# Patient Record
Sex: Female | Born: 2000 | Race: White | Hispanic: No | Marital: Single | Smoking: Current some day smoker
Health system: Southern US, Community
[De-identification: ages and names within clinical notes are randomized; demographics above are authoritative.]

## PROBLEM LIST (undated history)

## (undated) DIAGNOSIS — Z8619 Personal history of other infectious and parasitic diseases: Secondary | ICD-10-CM

## (undated) DIAGNOSIS — R569 Unspecified convulsions: Secondary | ICD-10-CM

## (undated) DIAGNOSIS — F32A Depression, unspecified: Secondary | ICD-10-CM

## (undated) DIAGNOSIS — G43909 Migraine, unspecified, not intractable, without status migrainosus: Secondary | ICD-10-CM

## (undated) DIAGNOSIS — E119 Type 2 diabetes mellitus without complications: Secondary | ICD-10-CM

## (undated) DIAGNOSIS — F419 Anxiety disorder, unspecified: Secondary | ICD-10-CM

## (undated) HISTORY — PX: CHOLECYSTECTOMY: SHX55

## (undated) HISTORY — PX: DENTAL RESTORATION/EXTRACTION WITH X-RAY: SHX5796

## (undated) HISTORY — DX: Anxiety disorder, unspecified: F41.9

## (undated) HISTORY — DX: Depression, unspecified: F32.A

---
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
## 2020-03-01 ENCOUNTER — Other Ambulatory Visit: Payer: Self-pay | Admitting: Certified Nurse Midwife

## 2020-03-01 DIAGNOSIS — N6310 Unspecified lump in the right breast, unspecified quadrant: Secondary | ICD-10-CM

## 2020-03-09 ENCOUNTER — Ambulatory Visit: Payer: BC Managed Care – PPO | Admitting: Neurology

## 2020-03-09 ENCOUNTER — Emergency Department (HOSPITAL_COMMUNITY)
Admission: EM | Admit: 2020-03-09 | Discharge: 2020-03-10 | Disposition: A | Discharge status: Home / Self Care | Payer: BLUE CROSS/BLUE SHIELD | Attending: Emergency Medicine | Admitting: Emergency Medicine

## 2020-03-09 ENCOUNTER — Encounter (HOSPITAL_COMMUNITY): Payer: Self-pay | Admitting: Emergency Medicine

## 2020-03-09 ENCOUNTER — Other Ambulatory Visit: Payer: Self-pay

## 2020-03-09 DIAGNOSIS — G43909 Migraine, unspecified, not intractable, without status migrainosus: Secondary | ICD-10-CM | POA: Insufficient documentation

## 2020-03-09 DIAGNOSIS — R569 Unspecified convulsions: Secondary | ICD-10-CM | POA: Insufficient documentation

## 2020-03-09 LAB — CBC
HCT: 36.7 % (ref 36.0–46.0)
Hemoglobin: 12 g/dL (ref 12.0–15.0)
MCH: 30.7 pg (ref 26.0–34.0)
MCHC: 32.7 g/dL (ref 30.0–36.0)
MCV: 93.9 fL (ref 80.0–100.0)
Platelets: 247 10*3/uL (ref 150–400)
RBC: 3.91 MIL/uL (ref 3.87–5.11)
RDW: 12 % (ref 11.5–15.5)
WBC: 6 10*3/uL (ref 4.0–10.5)
nRBC: 0 % (ref 0.0–0.2)

## 2020-03-09 LAB — BASIC METABOLIC PANEL
Anion gap: 10 (ref 5–15)
BUN: 12 mg/dL (ref 6–20)
CO2: 22 mmol/L (ref 22–32)
Calcium: 9.3 mg/dL (ref 8.9–10.3)
Chloride: 106 mmol/L (ref 98–111)
Creatinine, Ser: 0.77 mg/dL (ref 0.44–1.00)
GFR calc Af Amer: >60 mL/min (ref >60)
GFR calc non Af Amer: >60 mL/min (ref >60)
Glucose, Bld: 104 mg/dL — ABNORMAL HIGH (ref 70–99)
Potassium: 3.8 mmol/L (ref 3.5–5.1)
Sodium: 138 mmol/L (ref 135–145)

## 2020-03-09 LAB — I-STAT BETA HCG BLOOD, ED (MC, WL, AP ONLY): I-stat hCG, quantitative: <5 m[IU]/mL (ref <5)

## 2020-03-09 NOTE — ED Notes (Signed)
Beta results <5.0 IU/L

## 2020-03-09 NOTE — ED Triage Notes (Signed)
Patient states that she thinks she had a seizure 03/08/2020. Patient does not know when she had her last period. Patient states she had seizures as a baby. Patient is not currently taking any medication because they took her off it when she was long.

## 2020-03-10 NOTE — Discharge Instructions (Signed)
Per [place] DMV statutes, patients with seizures are not allowed to drive until  they have been seizure-free for six months. Use caution when using heavy equipment or power tools. Avoid working on ladders or at heights. Take showers instead of baths. Ensure the water temperature is not too high on the home water heater. Do not go swimming alone. When caring for infants or small children, sit down when holding, feeding, or changing them to minimize risk of injury to the child in the event you have a seizure.   Also, Maintain good sleep hygiene. Avoid alcohol.  

## 2020-03-10 NOTE — ED Provider Notes (Signed)
[facility] DEPT Provider Note   CSN: [id number] Arrival date & time: 03/09/20  2033     History Chief Complaint  Patient presents with  . Seizures    Morgan [name] is a 19 y.o. female.  Patient presents to the ED with a chief complaint of seizure.  She states that last night her boyfriend told her that she was twitching for few seconds while they were asleep.  Patient states that she has hx of seizures, but doesn't take any meds because she hasn't had a seizure in years.  She is not followed by a neurologist.  She denies any vision changes or speech changes.  She denies any other associated symptoms.  The history is provided by the patient. No language interpreter was used.       Past Medical History:  Diagnosis Date  . Migraines   . Seizures ([place])     There are no problems to display for this patient.   Past Surgical History:  Procedure Laterality Date  . CHOLECYSTECTOMY       OB History   No obstetric history on file.     History reviewed. No pertinent family history.  Social History   Tobacco Use  . Smoking status: Never Smoker  . Smokeless tobacco: Never Used  Vaping Use  . Vaping Use: Some days  Substance Use Topics  . Alcohol use: Never  . Drug use: Never    Home Medications Prior to Admission medications   Medication Sig Start Date End Date Taking? Authorizing Provider  lidocaine (XYLOCAINE) 2 % solution Use as directed 15 mLs in the mouth or throat every 3 (three) hours as needed for mouth pain. 10/07/18   McDonald, Mia A, PA-C  ondansetron (ZOFRAN ODT) 4 MG disintegrating tablet Take 1 tablet (4 mg total) by mouth every 8 (eight) hours as needed for nausea or vomiting. 10/07/18   McDonald, Mia A, PA-C    Allergies    Patient has no known allergies.  Review of Systems   Review of Systems  Neurological: Positive for seizures.  All other systems reviewed and are negative.   Physical Exam Updated Vital  Signs BP 125/78 (BP Location: Left Arm)   Pulse 78   Temp 98.7 F (37.1 C) (Oral)   Resp 14   Ht 4\' 11"  (1.499 m)   Wt 43.1 kg   LMP  (LMP Unknown)   SpO2 100%   BMI 19.19 kg/m   Physical Exam Vitals and nursing note reviewed.  Constitutional:      General: She is not in acute distress.    Appearance: She is well-developed.  HENT:     Head: Normocephalic and atraumatic.  Eyes:     Conjunctiva/sclera: Conjunctivae normal.  Cardiovascular:     Rate and Rhythm: Normal rate and regular rhythm.     Heart sounds: No murmur heard.   Pulmonary:     Effort: Pulmonary effort is normal. No respiratory distress.     Breath sounds: Normal breath sounds.  Abdominal:     Palpations: Abdomen is soft.     Tenderness: There is no abdominal tenderness.  Musculoskeletal:        General: Normal range of motion.     Cervical back: Neck supple.  Skin:    General: Skin is warm and dry.  Neurological:     Mental Status: She is alert and oriented to person, place, and time.     Comments: CN 3-12 intact Speech is clear  Normal gait Movements are goal oriented  Psychiatric:        Mood and Affect: Mood normal.        Behavior: Behavior normal.     ED Results / Procedures / Treatments   Labs (all labs ordered are listed, but only abnormal results are displayed) Labs Reviewed  BASIC METABOLIC PANEL - Abnormal; Notable for the following components:      Result Value   Glucose, Bld 104 (*)    All other components within normal limits  CBC  I-STAT BETA HCG BLOOD, ED ([facility], [facility], [facility] ONLY)    EKG None  Radiology No results found.  Procedures Procedures (including critical care time)  Medications Ordered in ED Medications - No data to display  ED Course  I have reviewed the triage vital signs and the nursing notes.  Pertinent labs & imaging results that were available during my care of the patient were reviewed by me and considered in my medical decision making (see chart for  details).    MDM Rules/Calculators/A&P                          Patient here for evaluation of possible seizure. Reportedly had a few seconds of twitching last night while asleep.  Hasn't had a seizure in years, but has had them before.  She isn't treated for them and reports having had a normal EEG in the past.    Labs are reassuring.  No neuro deficit.  I don't think that she requires any additional emergent workup or consultation.  I have placed ambulatory referral for neurology and have put patient on driving restrictions.  She understands and agrees with the plan.   Final Clinical Impression(s) / ED Diagnoses Final diagnoses:  Seizure-like activity [facility])    Rx / [place] Orders ED Discharge Orders    None       Montine Circle, PA-C 09/81/19 1478    Delora Fuel, MD 29/56/21 [phone]

## 2020-03-14 ENCOUNTER — Encounter: Payer: Self-pay | Admitting: Neurology

## 2020-03-14 ENCOUNTER — Other Ambulatory Visit: Payer: Self-pay | Admitting: Certified Nurse Midwife

## 2020-03-16 ENCOUNTER — Other Ambulatory Visit: Payer: Self-pay | Admitting: Certified Nurse Midwife

## 2020-03-16 ENCOUNTER — Ambulatory Visit
Admission: RE | Admit: 2020-03-16 | Discharge: 2020-03-16 | Disposition: A | Discharge status: Home / Self Care | Payer: Medicaid Other | Source: Ambulatory Visit | Attending: Certified Nurse Midwife | Admitting: Certified Nurse Midwife

## 2020-03-16 ENCOUNTER — Other Ambulatory Visit: Payer: Self-pay

## 2020-03-16 DIAGNOSIS — N6310 Unspecified lump in the right breast, unspecified quadrant: Secondary | ICD-10-CM

## 2020-03-23 ENCOUNTER — Other Ambulatory Visit: Payer: Self-pay | Admitting: Certified Nurse Midwife

## 2020-03-23 ENCOUNTER — Other Ambulatory Visit: Payer: Self-pay

## 2020-03-23 ENCOUNTER — Ambulatory Visit
Admission: RE | Admit: 2020-03-23 | Discharge: 2020-03-23 | Disposition: A | Discharge status: Home / Self Care | Payer: Medicaid Other | Source: Ambulatory Visit | Attending: Certified Nurse Midwife | Admitting: Certified Nurse Midwife

## 2020-03-23 DIAGNOSIS — N6310 Unspecified lump in the right breast, unspecified quadrant: Secondary | ICD-10-CM

## 2020-06-20 ENCOUNTER — Ambulatory Visit: Payer: Medicaid Other | Admitting: Neurology

## 2020-07-16 ENCOUNTER — Ambulatory Visit (HOSPITAL_COMMUNITY)
Admission: EM | Admit: 2020-07-16 | Discharge: 2020-07-16 | Disposition: A | Discharge status: Home / Self Care | Payer: Medicaid Other | Attending: Family Medicine | Admitting: Family Medicine

## 2020-07-16 ENCOUNTER — Encounter (HOSPITAL_COMMUNITY): Payer: Self-pay

## 2020-07-16 ENCOUNTER — Other Ambulatory Visit: Payer: Self-pay

## 2020-07-16 DIAGNOSIS — Z9049 Acquired absence of other specified parts of digestive tract: Secondary | ICD-10-CM | POA: Insufficient documentation

## 2020-07-16 DIAGNOSIS — R112 Nausea with vomiting, unspecified: Secondary | ICD-10-CM

## 2020-07-16 DIAGNOSIS — Z886 Allergy status to analgesic agent status: Secondary | ICD-10-CM | POA: Diagnosis not present

## 2020-07-16 DIAGNOSIS — Z20822 Contact with and (suspected) exposure to covid-19: Secondary | ICD-10-CM | POA: Insufficient documentation

## 2020-07-16 DIAGNOSIS — F17200 Nicotine dependence, unspecified, uncomplicated: Secondary | ICD-10-CM | POA: Insufficient documentation

## 2020-07-16 DIAGNOSIS — Z8379 Family history of other diseases of the digestive system: Secondary | ICD-10-CM | POA: Insufficient documentation

## 2020-07-16 LAB — SARS CORONAVIRUS 2 (TAT 6-24 HRS): SARS Coronavirus 2: NEGATIVE

## 2020-07-16 MED ORDER — ONDANSETRON 4 MG PO TBDP
ORAL_TABLET | ORAL | Status: AC
  Filled 2020-07-16: qty 1

## 2020-07-16 MED ORDER — ONDANSETRON 4 MG PO TBDP: 4.0000 mg | ORAL_TABLET | Freq: Three times a day (TID) | ORAL | 0 refills | Status: DC | PRN

## 2020-07-16 MED ORDER — ONDANSETRON 4 MG PO TBDP
4.0000 mg | ORAL_TABLET | Freq: Once | ORAL | Status: AC
  Administered 2020-07-16: 4 mg via ORAL

## 2020-07-16 NOTE — ED Triage Notes (Signed)
Patient presents to Urgent Care with complaints of vomiting x 3 days ago. Patient reports not tolerating anything PO.

## 2020-07-16 NOTE — ED Provider Notes (Addendum)
[facility]    CSN: [id number] Arrival date & time: 07/16/20  1152      History   Chief Complaint Chief Complaint  Patient presents with  . Emesis    started x 3 days ago     HPI Morgan [name] is a 19 y.o. female.   HPI Patient is here for vomiting for 3 days.  She states it started after she had 2 alcoholic beverages one evening.  She has had no diarrhea.  No abdominal pain.  No fever or chills.  She states she throws up almost everything she tries to eat or drink.  She is certain that she is not pregnant.  She has not eaten anything that did not agree with her.  No recent travel.  No one else in the household is sick.  Past Medical History:  Diagnosis Date  . Migraines   . Seizures ([place])     There are no problems to display for this patient.   Past Surgical History:  Procedure Laterality Date  . CHOLECYSTECTOMY      OB History   No obstetric history on file.      Home Medications    Prior to Admission medications   Medication Sig Start Date End Date Taking? Authorizing Provider  lidocaine (XYLOCAINE) 2 % solution Use as directed 15 mLs in the mouth or throat every 3 (three) hours as needed for mouth pain. 10/07/18   McDonald, Mia A, PA-C  ondansetron (ZOFRAN ODT) 4 MG disintegrating tablet Take 1 tablet (4 mg total) by mouth every 8 (eight) hours as needed for nausea or vomiting. 07/16/20   Raylene Everts, MD    Family History Family History  Problem Relation Age of Onset  . Crohn's disease Mother     Social History Social History   Tobacco Use  . Smoking status: Current Some Day Smoker  . Smokeless tobacco: Never Used  . Tobacco comment: social smoker   Vaping Use  . Vaping Use: Some days  Substance Use Topics  . Alcohol use: Never  . Drug use: Never     Allergies   Aspirin   Review of Systems Review of Systems See HPI  Physical Exam Triage Vital Signs ED Triage Vitals  Enc Vitals Group     BP 07/16/20 1334  100/62     Pulse Rate 07/16/20 1334 72     Resp 07/16/20 1334 18     Temp 07/16/20 1334 98.6 F (37 C)     Temp Source 07/16/20 1334 Oral     SpO2 07/16/20 1334 100 %     Weight --      Height --      Head Circumference --      Peak Flow --      Pain Score 07/16/20 1328 4     Pain Loc --      Pain Edu? --      Excl. in [place]? --    No data found.  Updated Vital Signs BP 100/62 (BP Location: Left Arm)   Pulse 72   Temp 98.6 F (37 C) (Oral)   Resp 18   LMP 06/23/2019   SpO2 100%      Physical Exam Constitutional:      General: She is not in acute distress.    Appearance: She is well-developed.     Comments: Patient is small and frail in appearance.  Appears tired  HENT:     Head: Normocephalic and atraumatic.  Mouth/Throat:     Mouth: Mucous membranes are moist.  Eyes:     Conjunctiva/sclera: Conjunctivae normal.     Pupils: Pupils are equal, round, and reactive to light.  Cardiovascular:     Rate and Rhythm: Normal rate.  Pulmonary:     Effort: Pulmonary effort is normal. No respiratory distress.  Abdominal:     General: Abdomen is flat. There is no distension.     Palpations: Abdomen is soft.     Comments: No abdominal tenderness  Musculoskeletal:        General: Normal range of motion.     Cervical back: Normal range of motion.  Skin:    General: Skin is warm and dry.  Neurological:     Mental Status: She is alert.  Psychiatric:        Mood and Affect: Mood normal.        Behavior: Behavior normal.      UC Treatments / Results  Labs (all labs ordered are listed, but only abnormal results are displayed) Labs Reviewed  SARS CORONAVIRUS 2 (TAT 6-24 HRS)    EKG   Radiology No results found.  Procedures Procedures (including critical care time)  Medications Ordered in UC Medications  ondansetron (ZOFRAN-ODT) disintegrating tablet 4 mg (4 mg Oral Given 07/16/20 1357)    Initial Impression / Assessment and Plan / UC Course  I have  reviewed the triage vital signs and the nursing notes.  Pertinent labs & imaging results that were available during my care of the patient were reviewed by me and considered in my medical decision making (see chart for details).     *Patient states she has not had thing to drink for 3 days, however has normal vital signs and moist mucous membranes. After a dose of Zofran she was able to tolerate sips of Sprite She has sent home with Zofran, and instructions for advancement of diet Final Clinical Impressions(s) / UC Diagnoses   Final diagnoses:  Intractable vomiting with nausea, unspecified vomiting type     Discharge Instructions     Take the zofran as needed for nausea/vomiting Drink lots of water and liquids May advance to bland diet as tolerated Return as needed    ED Prescriptions    Medication Sig Dispense Auth. Provider   ondansetron (ZOFRAN ODT) 4 MG disintegrating tablet Take 1 tablet (4 mg total) by mouth every 8 (eight) hours as needed for nausea or vomiting. 20 tablet Raylene Everts, MD     PDMP not reviewed this encounter.   Raylene Everts, MD 07/16/20 1433    Raylene Everts, MD 07/16/20 [phone]

## 2020-07-16 NOTE — Discharge Instructions (Signed)
Take the zofran as needed for nausea/vomiting Drink lots of water and liquids May advance to bland diet as tolerated Return as needed

## 2020-12-26 DIAGNOSIS — Z3202 Encounter for pregnancy test, result negative: Secondary | ICD-10-CM | POA: Diagnosis not present

## 2020-12-26 DIAGNOSIS — Z7189 Other specified counseling: Secondary | ICD-10-CM | POA: Diagnosis not present

## 2020-12-26 DIAGNOSIS — Z113 Encounter for screening for infections with a predominantly sexual mode of transmission: Secondary | ICD-10-CM | POA: Diagnosis not present

## 2021-01-11 DIAGNOSIS — Z113 Encounter for screening for infections with a predominantly sexual mode of transmission: Secondary | ICD-10-CM | POA: Diagnosis not present

## 2021-02-12 DIAGNOSIS — J988 Other specified respiratory disorders: Secondary | ICD-10-CM | POA: Diagnosis not present

## 2021-03-30 ENCOUNTER — Encounter (HOSPITAL_COMMUNITY): Payer: Self-pay

## 2021-03-30 ENCOUNTER — Other Ambulatory Visit: Payer: Self-pay

## 2021-03-30 ENCOUNTER — Ambulatory Visit (HOSPITAL_COMMUNITY)
Admission: EM | Admit: 2021-03-30 | Discharge: 2021-03-30 | Disposition: A | Discharge status: Home / Self Care | Payer: Medicaid Other | Attending: Emergency Medicine | Admitting: Emergency Medicine

## 2021-03-30 DIAGNOSIS — R112 Nausea with vomiting, unspecified: Secondary | ICD-10-CM

## 2021-03-30 MED ORDER — ONDANSETRON 4 MG PO TBDP
ORAL_TABLET | ORAL | Status: AC
  Filled 2021-03-30: qty 1

## 2021-03-30 MED ORDER — ONDANSETRON 4 MG PO TBDP
8.0000 mg | ORAL_TABLET | Freq: Once | ORAL | Status: AC
  Administered 2021-03-30: 8 mg via ORAL

## 2021-03-30 MED ORDER — ONDANSETRON 4 MG PO TBDP: 4.0000 mg | ORAL_TABLET | Freq: Three times a day (TID) | ORAL | 0 refills | Status: DC | PRN

## 2021-03-30 NOTE — Discharge Instructions (Addendum)
Can use Zofran every 8 hours as needed for vomiting  If vomiting continues please go to the nearest emergency department for further evaluation

## 2021-03-30 NOTE — ED Provider Notes (Signed)
[place]    CSN: [id number] Arrival date & time: 03/30/21  1105      History   Chief Complaint Chief Complaint  Patient presents with  . Emesis    HPI Morgan [name] is a 20 y.o. female.   Patient presents with continuous vomiting for the last 2 days.  Denies fevers, chills, body aches, abdominal pain, diarrhea, constipation.  Denies recent travel, changes in diet, consumption of uncooked meats.  Known sick contacts.  Last menstrual period within 2 weeks.  Past Medical History:  Diagnosis Date  . Migraines   . Seizures ([place])     There are no problems to display for this patient.   Past Surgical History:  Procedure Laterality Date  . CHOLECYSTECTOMY      OB History   No obstetric history on file.      Home Medications    Prior to Admission medications   Medication Sig Start Date End Date Taking? Authorizing Provider  ondansetron (ZOFRAN ODT) 4 MG disintegrating tablet Take 1 tablet (4 mg total) by mouth every 8 (eight) hours as needed for nausea or vomiting. 03/30/21  Yes Elih Mooney R, NP  lidocaine (XYLOCAINE) 2 % solution Use as directed 15 mLs in the mouth or throat every 3 (three) hours as needed for mouth pain. 10/07/18   McDonald, Mia A, PA-C    Family History Family History  Problem Relation Age of Onset  . Crohn's disease Mother     Social History Social History   Tobacco Use  . Smoking status: Some Days    Pack years: 0.00  . Smokeless tobacco: Never  . Tobacco comments:    social smoker   Vaping Use  . Vaping Use: Some days  Substance Use Topics  . Alcohol use: Never  . Drug use: Yes    Frequency: 2.0 times per week    Types: Marijuana     Allergies   Aspirin   Review of Systems Review of Systems  Constitutional: Negative.   HENT: Negative.    Respiratory: Negative.    Cardiovascular: Negative.   Gastrointestinal:  Positive for nausea and vomiting. Negative for abdominal distention, abdominal pain, anal  bleeding, blood in stool, constipation, diarrhea and rectal pain.  Genitourinary: Negative.   Skin: Negative.   Neurological: Negative.     Physical Exam Triage Vital Signs ED Triage Vitals  Enc Vitals Group     BP 03/30/21 1205 114/70     Pulse Rate 03/30/21 1205 69     Resp 03/30/21 1205 18     Temp 03/30/21 1205 97.7 F (36.5 C)     Temp Source 03/30/21 1205 Oral     SpO2 03/30/21 1205 100 %     Weight --      Height --      Head Circumference --      Peak Flow --      Pain Score 03/30/21 1203 0     Pain Loc --      Pain Edu? --      Excl. in [place]? --    No data found.  Updated Vital Signs BP 114/70 (BP Location: Right Arm)   Pulse 69   Temp 97.7 F (36.5 C) (Oral)   Resp 18   LMP  (Within Weeks) Comment: 2 weeks  SpO2 100%   Visual Acuity Right Eye Distance:   Left Eye Distance:   Bilateral Distance:    Right Eye Near:   Left Eye Near:  Bilateral Near:     Physical Exam Constitutional:      Appearance: She is normal weight. She is ill-appearing.  HENT:     Head: Normocephalic.  Eyes:     Extraocular Movements: Extraocular movements intact.  Cardiovascular:     Rate and Rhythm: Normal rate and regular rhythm.     Pulses: Normal pulses.     Heart sounds: Normal heart sounds.  Pulmonary:     Effort: Pulmonary effort is normal.     Breath sounds: Normal breath sounds.  Abdominal:     General: Abdomen is flat. Bowel sounds are normal. There is no distension.     Palpations: Abdomen is soft.     Tenderness: There is no abdominal tenderness. There is no guarding.  Skin:    General: Skin is warm and dry.  Neurological:     Mental Status: She is alert and oriented to person, place, and time. Mental status is at baseline.  Psychiatric:        Mood and Affect: Mood normal.        Behavior: Behavior normal.     UC Treatments / Results  Labs (all labs ordered are listed, but only abnormal results are displayed) Labs Reviewed - No data to  display  EKG   Radiology No results found.  Procedures Procedures (including critical care time)  Medications Ordered in UC Medications  ondansetron (ZOFRAN-ODT) disintegrating tablet 8 mg (has no administration in time range)    Initial Impression / Assessment and Plan / UC Course  I have reviewed the triage vital signs and the nursing notes.  Pertinent labs & imaging results that were available during my care of the patient were reviewed by me and considered in my medical decision making (see chart for details).  Intractable vomiting with nausea  1.  Zofran 8 mg ODT now 2.  Zofran 4 mg ODT every 8 hours as needed 3.  Patient encouraged to attempt to eat and drink to prevent dehydration once nausea subsides 4.  Strict precautions given to follow-up at the nearest emergency department for worsening symptoms  Final Clinical Impressions(s) / UC Diagnoses   Final diagnoses:  None   Discharge Instructions   None    ED Prescriptions     Medication Sig Dispense Auth. Provider   ondansetron (ZOFRAN ODT) 4 MG disintegrating tablet Take 1 tablet (4 mg total) by mouth every 8 (eight) hours as needed for nausea or vomiting. 20 tablet Hans Eden, NP      PDMP not reviewed this encounter.   Hans Eden, NP 03/30/21 [phone]

## 2021-03-30 NOTE — ED Triage Notes (Signed)
Pt reports she in being vomiting x 2 days. States this happened before and Zofran gives relief.

## 2021-06-29 DIAGNOSIS — Z113 Encounter for screening for infections with a predominantly sexual mode of transmission: Secondary | ICD-10-CM | POA: Diagnosis not present

## 2021-07-02 ENCOUNTER — Other Ambulatory Visit: Payer: Self-pay | Admitting: Nurse Practitioner

## 2021-07-02 DIAGNOSIS — Z01419 Encounter for gynecological examination (general) (routine) without abnormal findings: Secondary | ICD-10-CM | POA: Diagnosis not present

## 2021-07-02 DIAGNOSIS — N6342 Unspecified lump in left breast, subareolar: Secondary | ICD-10-CM | POA: Diagnosis not present

## 2021-07-02 DIAGNOSIS — N6313 Unspecified lump in the right breast, lower outer quadrant: Secondary | ICD-10-CM | POA: Diagnosis not present

## 2021-07-02 DIAGNOSIS — R300 Dysuria: Secondary | ICD-10-CM | POA: Diagnosis not present

## 2021-07-02 DIAGNOSIS — N6310 Unspecified lump in the right breast, unspecified quadrant: Secondary | ICD-10-CM

## 2021-07-12 DIAGNOSIS — Z01419 Encounter for gynecological examination (general) (routine) without abnormal findings: Secondary | ICD-10-CM | POA: Diagnosis not present

## 2021-07-27 ENCOUNTER — Other Ambulatory Visit: Payer: Self-pay | Admitting: Nurse Practitioner

## 2021-07-27 ENCOUNTER — Ambulatory Visit
Admission: RE | Admit: 2021-07-27 | Discharge: 2021-07-27 | Disposition: A | Discharge status: Home / Self Care | Payer: Medicaid Other | Source: Ambulatory Visit | Attending: Nurse Practitioner | Admitting: Nurse Practitioner

## 2021-07-27 ENCOUNTER — Other Ambulatory Visit: Payer: Self-pay

## 2021-07-27 DIAGNOSIS — N6320 Unspecified lump in the left breast, unspecified quadrant: Secondary | ICD-10-CM

## 2021-07-27 DIAGNOSIS — N6324 Unspecified lump in the left breast, lower inner quadrant: Secondary | ICD-10-CM | POA: Diagnosis not present

## 2021-07-27 DIAGNOSIS — N6310 Unspecified lump in the right breast, unspecified quadrant: Secondary | ICD-10-CM

## 2021-07-27 DIAGNOSIS — N6313 Unspecified lump in the right breast, lower outer quadrant: Secondary | ICD-10-CM | POA: Diagnosis not present

## 2021-08-03 DIAGNOSIS — J029 Acute pharyngitis, unspecified: Secondary | ICD-10-CM | POA: Diagnosis not present

## 2021-08-03 DIAGNOSIS — Z1159 Encounter for screening for other viral diseases: Secondary | ICD-10-CM | POA: Diagnosis not present

## 2021-08-03 DIAGNOSIS — J988 Other specified respiratory disorders: Secondary | ICD-10-CM | POA: Diagnosis not present

## 2021-08-10 ENCOUNTER — Ambulatory Visit: Payer: Self-pay | Admitting: Surgery

## 2021-08-10 DIAGNOSIS — N6313 Unspecified lump in the right breast, lower outer quadrant: Secondary | ICD-10-CM | POA: Diagnosis not present

## 2021-08-20 ENCOUNTER — Ambulatory Visit: Payer: Self-pay | Admitting: Surgery

## 2021-08-20 ENCOUNTER — Other Ambulatory Visit: Payer: Self-pay | Admitting: Surgery

## 2021-08-20 DIAGNOSIS — Z1159 Encounter for screening for other viral diseases: Secondary | ICD-10-CM | POA: Diagnosis not present

## 2021-08-20 DIAGNOSIS — J069 Acute upper respiratory infection, unspecified: Secondary | ICD-10-CM | POA: Diagnosis not present

## 2021-08-20 DIAGNOSIS — N6310 Unspecified lump in the right breast, unspecified quadrant: Secondary | ICD-10-CM

## 2021-10-08 ENCOUNTER — Encounter (HOSPITAL_BASED_OUTPATIENT_CLINIC_OR_DEPARTMENT_OTHER): Payer: Self-pay | Admitting: Surgery

## 2021-10-08 ENCOUNTER — Other Ambulatory Visit: Payer: Self-pay

## 2021-10-15 DIAGNOSIS — U071 COVID-19: Secondary | ICD-10-CM | POA: Diagnosis not present

## 2021-10-15 DIAGNOSIS — R6889 Other general symptoms and signs: Secondary | ICD-10-CM | POA: Diagnosis not present

## 2021-11-14 ENCOUNTER — Encounter (HOSPITAL_BASED_OUTPATIENT_CLINIC_OR_DEPARTMENT_OTHER): Payer: Self-pay | Admitting: Surgery

## 2021-11-14 ENCOUNTER — Other Ambulatory Visit: Payer: Self-pay

## 2021-11-21 ENCOUNTER — Ambulatory Visit
Admission: RE | Admit: 2021-11-21 | Discharge: 2021-11-21 | Disposition: A | Discharge status: Home / Self Care | Payer: Medicaid Other | Source: Ambulatory Visit | Attending: Surgery | Admitting: Surgery

## 2021-11-21 ENCOUNTER — Other Ambulatory Visit: Payer: Self-pay | Admitting: Surgery

## 2021-11-21 DIAGNOSIS — N6313 Unspecified lump in the right breast, lower outer quadrant: Secondary | ICD-10-CM

## 2021-11-21 DIAGNOSIS — N6310 Unspecified lump in the right breast, unspecified quadrant: Secondary | ICD-10-CM

## 2021-11-21 DIAGNOSIS — N6324 Unspecified lump in the left breast, lower inner quadrant: Secondary | ICD-10-CM | POA: Diagnosis not present

## 2021-11-21 NOTE — Anesthesia Preprocedure Evaluation (Addendum)
Anesthesia Evaluation  ?Patient identified by MRN, date of birth, ID band ?Patient awake ? ? ? ?Reviewed: ?Allergy & Precautions, NPO status , Patient's Chart, lab work & pertinent test results ? ?Airway ?Mallampati: I ? ?TM Distance: >3 FB ?Neck ROM: Full ? ? ? Dental ? ?(+) Teeth Intact, Dental Advisory Given ?  ?Pulmonary ?Current Smoker and Patient abstained from smoking.,  ?  ?Pulmonary exam normal ?breath sounds clear to auscultation ? ? ? ? ? ? Cardiovascular ?negative cardio ROS ?Normal cardiovascular exam ?Rhythm:Regular Rate:Normal ? ? ?  ?Neuro/Psych ? Headaches, neg Seizures negative psych ROS  ? GI/Hepatic ?negative GI ROS, (+)  ?  ? substance abuse (daily marijuana ) ? marijuana use,   ?Endo/Other  ?negative endocrine ROS ? Renal/GU ?negative Renal ROS  ?negative genitourinary ?  ?Musculoskeletal ?negative musculoskeletal ROS ?(+)  ? Abdominal ?  ?Peds ? Hematology ?negative hematology ROS ?(+)   ?Anesthesia Other Findings ?R breast mass  ? Reproductive/Obstetrics ?negative OB ROS ? ?  ? ? ? ? ? ? ? ? ? ? ? ? ? ?  ?  ? ? ? ? ? ? ?Anesthesia Physical ?Anesthesia Plan ? ?ASA: 1 ? ?Anesthesia Plan: General  ? ?Post-op Pain Management: Tylenol PO (pre-op)* and Toradol IV (intra-op)*  ? ?Induction: Intravenous ? ?PONV Risk Score and Plan: 2 and Ondansetron, Dexamethasone, Midazolam, Scopolamine patch - Pre-op and Treatment may vary due to age or medical condition ? ?Airway Management Planned: LMA ? ?Additional Equipment: None ? ?Intra-op Plan:  ? ?Post-operative Plan: Extubation in OR ? ?Informed Consent: I have reviewed the patients History and Physical, chart, labs and discussed the procedure including the risks, benefits and alternatives for the proposed anesthesia with the patient or authorized representative who has indicated his/her understanding and acceptance.  ? ? ? ?Dental advisory given ? ?Plan Discussed with: CRNA ? ?Anesthesia Plan Comments:    ? ? ? ? ? ?Anesthesia Quick Evaluation ? ?

## 2021-11-22 ENCOUNTER — Ambulatory Visit (HOSPITAL_BASED_OUTPATIENT_CLINIC_OR_DEPARTMENT_OTHER): Payer: BLUE CROSS/BLUE SHIELD | Admitting: Anesthesiology

## 2021-11-22 ENCOUNTER — Ambulatory Visit (HOSPITAL_BASED_OUTPATIENT_CLINIC_OR_DEPARTMENT_OTHER)
Admission: RE | Admit: 2021-11-22 | Discharge: 2021-11-22 | Disposition: A | Discharge status: Home / Self Care | Payer: BLUE CROSS/BLUE SHIELD | Attending: Surgery | Admitting: Surgery

## 2021-11-22 ENCOUNTER — Ambulatory Visit
Admission: RE | Admit: 2021-11-22 | Discharge: 2021-11-22 | Disposition: A | Discharge status: Home / Self Care | Payer: Medicaid Other | Source: Ambulatory Visit | Attending: Surgery | Admitting: Surgery

## 2021-11-22 ENCOUNTER — Encounter (HOSPITAL_BASED_OUTPATIENT_CLINIC_OR_DEPARTMENT_OTHER): Payer: Self-pay | Admitting: Surgery

## 2021-11-22 ENCOUNTER — Other Ambulatory Visit: Payer: Self-pay

## 2021-11-22 ENCOUNTER — Encounter (HOSPITAL_BASED_OUTPATIENT_CLINIC_OR_DEPARTMENT_OTHER)
Admission: RE | Disposition: A | Discharge status: Home / Self Care | Payer: Self-pay | Source: Home / Self Care | Attending: Surgery

## 2021-11-22 DIAGNOSIS — F17210 Nicotine dependence, cigarettes, uncomplicated: Secondary | ICD-10-CM | POA: Diagnosis not present

## 2021-11-22 DIAGNOSIS — R519 Headache, unspecified: Secondary | ICD-10-CM | POA: Diagnosis not present

## 2021-11-22 DIAGNOSIS — N6320 Unspecified lump in the left breast, unspecified quadrant: Secondary | ICD-10-CM | POA: Diagnosis not present

## 2021-11-22 DIAGNOSIS — N6310 Unspecified lump in the right breast, unspecified quadrant: Secondary | ICD-10-CM

## 2021-11-22 DIAGNOSIS — D241 Benign neoplasm of right breast: Secondary | ICD-10-CM | POA: Diagnosis not present

## 2021-11-22 DIAGNOSIS — R928 Other abnormal and inconclusive findings on diagnostic imaging of breast: Secondary | ICD-10-CM | POA: Diagnosis not present

## 2021-11-22 DIAGNOSIS — Z01818 Encounter for other preprocedural examination: Secondary | ICD-10-CM

## 2021-11-22 HISTORY — DX: Personal history of other infectious and parasitic diseases: Z86.19

## 2021-11-22 HISTORY — PX: BREAST LUMPECTOMY WITH RADIOACTIVE SEED LOCALIZATION: SHX6424

## 2021-11-22 LAB — POCT PREGNANCY, URINE: Preg Test, Ur: NEGATIVE

## 2021-11-22 SURGERY — BREAST LUMPECTOMY WITH RADIOACTIVE SEED LOCALIZATION
Anesthesia: General | Site: Breast | Laterality: Right

## 2021-11-22 MED ORDER — SCOPOLAMINE 1 MG/3DAYS TD PT72
MEDICATED_PATCH | TRANSDERMAL | Status: AC
  Filled 2021-11-22: qty 1

## 2021-11-22 MED ORDER — ONDANSETRON HCL 4 MG/2ML IJ SOLN: 4.0000 mg | Freq: Once | INTRAMUSCULAR | Status: DC | PRN

## 2021-11-22 MED ORDER — PROPOFOL 10 MG/ML IV BOLUS
INTRAVENOUS | Status: AC
  Filled 2021-11-22: qty 20

## 2021-11-22 MED ORDER — PROPOFOL 10 MG/ML IV BOLUS
INTRAVENOUS | Status: DC | PRN
  Administered 2021-11-22: 200 mg via INTRAVENOUS

## 2021-11-22 MED ORDER — BUPIVACAINE-EPINEPHRINE (PF) 0.25% -1:200000 IJ SOLN
INTRAMUSCULAR | Status: DC | PRN
  Administered 2021-11-22: 17 mL

## 2021-11-22 MED ORDER — ACETAMINOPHEN 500 MG PO TABS
ORAL_TABLET | ORAL | Status: AC
  Filled 2021-11-22: qty 2

## 2021-11-22 MED ORDER — OXYCODONE HCL 5 MG/5ML PO SOLN: 5.0000 mg | Freq: Once | ORAL | Status: DC | PRN

## 2021-11-22 MED ORDER — MIDAZOLAM HCL 2 MG/2ML IJ SOLN
INTRAMUSCULAR | Status: DC | PRN
  Administered 2021-11-22: 2 mg via INTRAVENOUS

## 2021-11-22 MED ORDER — GLYCOPYRROLATE 0.2 MG/ML IJ SOLN
INTRAMUSCULAR | Status: DC | PRN
  Administered 2021-11-22: .2 mg via INTRAVENOUS

## 2021-11-22 MED ORDER — LIDOCAINE HCL (CARDIAC) PF 100 MG/5ML IV SOSY
PREFILLED_SYRINGE | INTRAVENOUS | Status: DC | PRN
  Administered 2021-11-22: 60 mg via INTRAVENOUS

## 2021-11-22 MED ORDER — ACETAMINOPHEN 325 MG PO TABS
650.0000 mg | ORAL_TABLET | Freq: Once | ORAL | Status: AC
  Administered 2021-11-22: 500 mg via ORAL

## 2021-11-22 MED ORDER — HYDROMORPHONE HCL 1 MG/ML IJ SOLN
INTRAMUSCULAR | Status: AC
  Filled 2021-11-22: qty 0.5

## 2021-11-22 MED ORDER — SODIUM CHLORIDE 0.9 % IV SOLN
INTRAVENOUS | Status: DC | PRN
  Administered 2021-11-22: 250 mL

## 2021-11-22 MED ORDER — FENTANYL CITRATE (PF) 100 MCG/2ML IJ SOLN
INTRAMUSCULAR | Status: AC
  Filled 2021-11-22: qty 2

## 2021-11-22 MED ORDER — SODIUM CHLORIDE 0.9 % IV SOLN
INTRAVENOUS | Status: AC
  Filled 2021-11-22: qty 10

## 2021-11-22 MED ORDER — ONDANSETRON HCL 4 MG/2ML IJ SOLN
INTRAMUSCULAR | Status: AC
  Filled 2021-11-22: qty 2

## 2021-11-22 MED ORDER — MIDAZOLAM HCL 2 MG/2ML IJ SOLN
INTRAMUSCULAR | Status: AC
  Filled 2021-11-22: qty 2

## 2021-11-22 MED ORDER — CEFAZOLIN SODIUM-DEXTROSE 2-4 GM/100ML-% IV SOLN
INTRAVENOUS | Status: AC
  Filled 2021-11-22: qty 100

## 2021-11-22 MED ORDER — LACTATED RINGERS IV SOLN: INTRAVENOUS | Status: DC

## 2021-11-22 MED ORDER — AMISULPRIDE (ANTIEMETIC) 5 MG/2ML IV SOLN
INTRAVENOUS | Status: AC
  Filled 2021-11-22: qty 4

## 2021-11-22 MED ORDER — DEXAMETHASONE SODIUM PHOSPHATE 10 MG/ML IJ SOLN
INTRAMUSCULAR | Status: AC
  Filled 2021-11-22: qty 1

## 2021-11-22 MED ORDER — FENTANYL CITRATE (PF) 100 MCG/2ML IJ SOLN
INTRAMUSCULAR | Status: DC | PRN
  Administered 2021-11-22: 25 ug via INTRAVENOUS
  Administered 2021-11-22: 100 ug via INTRAVENOUS
  Administered 2021-11-22: 25 ug via INTRAVENOUS

## 2021-11-22 MED ORDER — MEPERIDINE HCL 25 MG/ML IJ SOLN: 6.2500 mg | INTRAMUSCULAR | Status: DC | PRN

## 2021-11-22 MED ORDER — ONDANSETRON HCL 4 MG/2ML IJ SOLN
INTRAMUSCULAR | Status: DC | PRN
  Administered 2021-11-22: 4 mg via INTRAVENOUS

## 2021-11-22 MED ORDER — OXYCODONE HCL 5 MG PO TABS: 5.0000 mg | ORAL_TABLET | Freq: Once | ORAL | Status: DC | PRN

## 2021-11-22 MED ORDER — AMISULPRIDE (ANTIEMETIC) 5 MG/2ML IV SOLN
10.0000 mg | Freq: Once | INTRAVENOUS | Status: AC | PRN
  Administered 2021-11-22: 10 mg via INTRAVENOUS

## 2021-11-22 MED ORDER — HYDROMORPHONE HCL 1 MG/ML IJ SOLN
0.2500 mg | INTRAMUSCULAR | Status: DC | PRN
  Administered 2021-11-22: 0.5 mg via INTRAVENOUS

## 2021-11-22 MED ORDER — KETOROLAC TROMETHAMINE 30 MG/ML IJ SOLN: 30.0000 mg | Freq: Once | INTRAMUSCULAR | Status: DC | PRN

## 2021-11-22 MED ORDER — CEFAZOLIN SODIUM-DEXTROSE 2-3 GM-%(50ML) IV SOLR
INTRAVENOUS | Status: DC | PRN
Start: 2021-11-22 — End: 2021-11-22
  Administered 2021-11-22: 2 g via INTRAVENOUS

## 2021-11-22 MED ORDER — EPHEDRINE SULFATE (PRESSORS) 50 MG/ML IJ SOLN
INTRAMUSCULAR | Status: DC | PRN
  Administered 2021-11-22: 15 mg via INTRAVENOUS

## 2021-11-22 MED ORDER — DEXAMETHASONE SODIUM PHOSPHATE 10 MG/ML IJ SOLN
INTRAMUSCULAR | Status: DC | PRN
  Administered 2021-11-22: 10 mg via INTRAVENOUS

## 2021-11-22 MED ORDER — OXYCODONE HCL 5 MG PO TABS: 5.0000 mg | ORAL_TABLET | Freq: Four times a day (QID) | ORAL | 0 refills | Status: DC | PRN

## 2021-11-22 MED ORDER — SCOPOLAMINE 1 MG/3DAYS TD PT72
1.0000 | MEDICATED_PATCH | TRANSDERMAL | Status: DC
  Administered 2021-11-22: 1.5 mg via TRANSDERMAL

## 2021-11-22 MED ORDER — LIDOCAINE 2% (20 MG/ML) 5 ML SYRINGE
INTRAMUSCULAR | Status: AC
  Filled 2021-11-22: qty 5

## 2021-11-22 SURGICAL SUPPLY — 52 items
ADH SKN CLS APL DERMABOND .7 (GAUZE/BANDAGES/DRESSINGS) ×1
APL PRP STRL LF DISP 70% ISPRP (MISCELLANEOUS) ×1
APPLIER CLIP 9.375 MED OPEN (MISCELLANEOUS)
APR CLP MED 9.3 20 MLT OPN (MISCELLANEOUS)
BINDER BREAST LRG (GAUZE/BANDAGES/DRESSINGS) IMPLANT
BINDER BREAST MEDIUM (GAUZE/BANDAGES/DRESSINGS) ×1 IMPLANT
BLADE SURG 15 STRL LF DISP TIS (BLADE) ×1 IMPLANT
BLADE SURG 15 STRL SS (BLADE) ×2
CANISTER SUC SOCK COL 7IN (MISCELLANEOUS) IMPLANT
CANISTER SUCT 1200ML W/VALVE (MISCELLANEOUS) IMPLANT
CHLORAPREP W/TINT 26 (MISCELLANEOUS) ×2 IMPLANT
CLIP APPLIE 9.375 MED OPEN (MISCELLANEOUS) IMPLANT
COVER BACK TABLE 60X90IN (DRAPES) ×2 IMPLANT
COVER MAYO STAND STRL (DRAPES) ×2 IMPLANT
COVER PROBE W GEL 5X96 (DRAPES) ×2 IMPLANT
DERMABOND ADVANCED (GAUZE/BANDAGES/DRESSINGS) ×1
DERMABOND ADVANCED .7 DNX12 (GAUZE/BANDAGES/DRESSINGS) ×1 IMPLANT
DRAPE LAPAROTOMY 100X72 PEDS (DRAPES) ×2 IMPLANT
DRAPE UTILITY XL STRL (DRAPES) ×2 IMPLANT
ELECT COATED BLADE 2.86 ST (ELECTRODE) ×2 IMPLANT
ELECT REM PT RETURN 9FT ADLT (ELECTROSURGICAL) ×2
ELECTRODE REM PT RTRN 9FT ADLT (ELECTROSURGICAL) ×1 IMPLANT
GLOVE SRG 8 PF TXTR STRL LF DI (GLOVE) ×1 IMPLANT
GLOVE SURG LTX SZ8 (GLOVE) ×2 IMPLANT
GLOVE SURG POLYISO LF SZ6.5 (GLOVE) ×1 IMPLANT
GLOVE SURG POLYISO LF SZ7 (GLOVE) ×1 IMPLANT
GLOVE SURG UNDER POLY LF SZ6.5 (GLOVE) ×1 IMPLANT
GLOVE SURG UNDER POLY LF SZ7 (GLOVE) ×1 IMPLANT
GLOVE SURG UNDER POLY LF SZ8 (GLOVE) ×2
GOWN STRL REUS W/ TWL LRG LVL3 (GOWN DISPOSABLE) ×2 IMPLANT
GOWN STRL REUS W/ TWL XL LVL3 (GOWN DISPOSABLE) ×1 IMPLANT
GOWN STRL REUS W/TWL LRG LVL3 (GOWN DISPOSABLE) ×4
GOWN STRL REUS W/TWL XL LVL3 (GOWN DISPOSABLE) ×2
HEMOSTAT ARISTA ABSORB 3G PWDR (HEMOSTASIS) IMPLANT
HEMOSTAT SNOW SURGICEL 2X4 (HEMOSTASIS) IMPLANT
KIT MARKER MARGIN INK (KITS) ×2 IMPLANT
NDL HYPO 25X1 1.5 SAFETY (NEEDLE) ×1 IMPLANT
NEEDLE HYPO 25X1 1.5 SAFETY (NEEDLE) ×2 IMPLANT
NS IRRIG 1000ML POUR BTL (IV SOLUTION) ×2 IMPLANT
PACK BASIN DAY SURGERY FS (CUSTOM PROCEDURE TRAY) ×2 IMPLANT
PENCIL SMOKE EVACUATOR (MISCELLANEOUS) ×2 IMPLANT
SLEEVE SCD COMPRESS KNEE MED (STOCKING) ×2 IMPLANT
SPIKE FLUID TRANSFER (MISCELLANEOUS) IMPLANT
SPONGE T-LAP 4X18 ~~LOC~~+RFID (SPONGE) ×2 IMPLANT
SUT MNCRL AB 4-0 PS2 18 (SUTURE) ×2 IMPLANT
SUT SILK 2 0 SH (SUTURE) IMPLANT
SUT VICRYL 3-0 CR8 SH (SUTURE) ×2 IMPLANT
SYR CONTROL 10ML LL (SYRINGE) ×2 IMPLANT
TOWEL GREEN STERILE FF (TOWEL DISPOSABLE) ×2 IMPLANT
TRAY FAXITRON CT DISP (TRAY / TRAY PROCEDURE) ×2 IMPLANT
TUBE CONNECTING 20X1/4 (TUBING) ×1 IMPLANT
YANKAUER SUCT BULB TIP NO VENT (SUCTIONS) ×1 IMPLANT

## 2021-11-22 NOTE — H&P (Signed)
History of Present Illness: ?Morgan [name] is a 21 y.o. female who is seen today as an office consultation at the request of Dr. Gilford Rile for evaluation of Breast Mass ?.  ? ?Patient presents for evaluation of right breast mass. 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 She is having more pain now in the right breast lower outer quadrant. There are 3 nodules located here with 1 the core biopsy proven to be a fibroadenoma. 1 is changed in size and is causing more pain. She is here today to discuss this and consider surgery for treatment of this. Of note she has nodules on both sides and these all appear to be consistent with fibroadenoma of varying sizes. ?Review of Systems: ?A complete review of systems was obtained from the patient. I have reviewed this information and discussed as appropriate with the patient. See HPI as well for other ROS. ? ? ? ?Medical History: ?Past Medical History:  ?Diagnosis Date  ? Anxiety  ? Seizures (CMS-HCC)  ? ?There is no problem list on file for this patient. ? ?Past Surgical History:  ?Procedure Laterality Date  ? CHOLECYSTECTOMY  ? ? ?Allergies  ?Allergen Reactions  ? Aspirin Other (See Comments)  ?Abdominal pain  ? ?Current Outpatient Medications on File Prior to Visit  ?Medication Sig Dispense Refill  ? amoxicillin-clavulanate (AUGMENTIN) 875-125 mg tablet  ? valACYclovir (VALTREX) 500 MG tablet  ? ?No current facility-administered medications on file prior to visit.  ? ?History reviewed. No pertinent family history.  ? ?Social History  ? ?Tobacco Use  ?Smoking Status Some Days  ? Types: Cigarettes  ?Smokeless Tobacco Never  ? ? ?Social History  ? ?Socioeconomic History  ? Marital status: Single  ?Tobacco Use  ? Smoking status: Some Days  ?Types: Cigarettes  ? Smokeless tobacco: Never  ? ?Objective:  ? ?Vitals:  ?08/10/21 1114  ?BP: 122/80  ?Pulse: 80  ?Weight: (!) 35.9 kg (79 lb 3.2 oz)  ?Height: 149.9 cm (4\' 11" )  ? ?Body mass index is 16 kg/m?. ? ?Physical  Exam ?Constitutional:  ?Appearance: Normal appearance.  ?HENT:  ?Head: Normocephalic.  ?Nose: Congestion present.  ?Eyes:  ?General: No scleral icterus. ?Pulmonary:  ?Effort: Pulmonary effort is normal.  ?Breath sounds: No stridor.  ?Chest:  ?Breasts: ?Right: Mass present.  ?Left: Normal.  ? ?Musculoskeletal:  ?General: Normal range of motion.  ?Cervical back: Normal range of motion and neck supple.  ?Lymphadenopathy:  ?Upper Body:  ?Right upper body: No supraclavicular or axillary adenopathy.  ?Left upper body: No supraclavicular or axillary adenopathy.  ?Skin: ?General: Skin is warm and dry.  ?Neurological:  ?General: No focal deficit present.  ?Mental Status: She is alert.  ?Psychiatric:  ?Mood and Affect: Mood normal.  ?Behavior: Behavior normal.  ? ? ? ?Labs, Imaging and Diagnostic Testing: ?Diagnosis ?Breast, right, needle core biopsy, 8:30 o'clock ?- FIBROADENOMA ?- NO MALIGNANCY IDENTIFIED ?Interval follow-up of likely benign BILATERAL breast ?masses. The largest mass in the RIGHT breast is a biopsy-proven ?fibroadenoma (biopsy performed 03/23/2020). ?  ?The patient states that the largest biopsy-proven fibroadenoma ?continues to increase in size and is now very tender and painful. ?  ?EXAM: ?ULTRASOUND OF THE BILATERAL BREASTS ?  ?COMPARISON: Previous BILATERAL breast ultrasound 03/16/2020. ?  ?FINDINGS: ?RIGHT: Targeted ultrasound is performed, demonstrating that the ?biopsy-proven fibroadenoma at the 8:30 o'clock position 3 cm from ?nipple has significantly decreased in size since the 03/16/2020 ?ultrasound, currently measuring approximately 3.4 x 1.9 x 3.2 cm ?(previously 2.7 x 1.3  x 2.4 cm), demonstrating posterior acoustic ?enhancement. ?  ?The circumscribed oval parallel hypoechoic mass immediately adjacent ?to the biopsy-proven fibroadenoma at the 8:30 o'clock position 5 cm ?from nipple has also significantly increased in size in the ?interval, currently measuring approximately 1.8 x 0.6 x 1.7  cm ?(previously 1.3 x 0.5 x 1.1 cm), demonstrating posterior acoustic ?enhancement. ?  ?The adjacent circumscribed oval parallel hypoechoic mass at the 8 ?o'clock position 3 cm from nipple has also increased in size in the ?interval, currently measuring 2.0 x 0.9 x 1.3 cm (previously 1.6 x ?0.7 x 1.3 cm), demonstrating posterior acoustic enhancement. ?  ?The fourth circumscribed oval parallel hypoechoic mass at the 7 ?o'clock position 3 cm from the nipple is unchanged, currently ?measuring 1.2 x 0.5 x 1.1 cm (previously 1.3 x 0.5 x 1.0 cm), ?demonstrating posterior acoustic enhancement. ?  ?LEFT: Targeted ultrasound is performed, demonstrating that the ?previously identified circumscribed oval parallel hypoechoic mass at ?the 8 o'clock position 3 cm from the nipple is unchanged, currently ?measuring 1.9 x 0.5 x 1.9 cm (previously 1.8 x 0.4 x 1.6 cm), ?demonstrating posterior acoustic enhancement. ?  ?IMPRESSION: ?1. Interval significant increase in size of 3 adjacent masses in the ?LOWER OUTER QUADRANT of the RIGHT breast, including the largest mass ?which is a biopsy-proven fibroadenoma at the 8:30 o'clock position 5 ?cm from the nipple. ?2. Stable likely benign RIGHT breast mass at the 7 o'clock position ?3 cm from the nipple, also likely a fibroadenoma. ?3. Stable likely benign LEFT breast mass at 8 o'clock position 3 cm ?from the nipple, also likely a fibroadenoma ?  ?RECOMMENDATION: ?1. As the 3 adjacent masses in the LOWER OUTER QUADRANT of the RIGHT ?breast has significantly increased in size and are now symptomatic ?(including the largest mass which is a biopsy-proven fibroadenoma), ?surgical consultation is recommended in order to discuss excision. ?2. LEFT breast ultrasound in 6 months (in order to confirm 2 years ?of stability of the likely benign mass). RIGHT breast ultrasound can ?be performed at that time as well if the RIGHT breast masses are not ?excised. ?  ?Assessment and Plan:  ?Diagnoses and all  orders for this visit: ? ?Mass of lower outer quadrant of right breast ? ? ? ?Discussed the pros and cons of lumpectomy. She has relatively small breast and there could be a cosmetic impact to her. I described the procedure and I think the best course of action would be to bracketed the 3 small masses that are close together in the right breast lower outer quadrant with 2 seeds. That area could be removed which would include the clipped mass. Observation was discussed as well given breast size and benign pathology. It is causing more pain and there has been interval small increase in size from previous imaging. We discussed the natural history of fibroadenoma. She would like to proceed with right breast lumpectomy seed localized with a bracketed approach.The procedure has been discussed with the patient. Alternatives to surgery have been discussed with the patient. Risks of surgery include bleeding, Infection, Seroma formation, death, and the need for further surgery. The patient understands and wishes to proceed. ? ?No follow-ups on file.  ?

## 2021-11-22 NOTE — Anesthesia Procedure Notes (Signed)
Procedure Name: LMA Insertion ?Date/Time: 11/22/2021 7:43 AM ?Performed by: Verita Lamb, CRNA ?Pre-anesthesia Checklist: Patient identified, Emergency Drugs available, Suction available and Patient being monitored ?Patient Re-evaluated:Patient Re-evaluated prior to induction ?Oxygen Delivery Method: Circle system utilized ?Preoxygenation: Pre-oxygenation with 100% oxygen ?Induction Type: IV induction ?Ventilation: Mask ventilation without difficulty ?LMA: LMA inserted ?LMA Size: 3.0 ?Number of attempts: 1 ?Airway Equipment and Method: Bite block ?Placement Confirmation: positive ETCO2, CO2 detector and breath sounds checked- equal and bilateral ?Tube secured with: Tape ?Dental Injury: Teeth and Oropharynx as per pre-operative assessment  ?Comments: Risks of nasal piercing aspiration discussed with patient.  Pt stated she did not to remove the piercings so we taped them to secure their position and reduce risks.  Some of the piercings are metal and the risks of burns also discussed.  She stated she understands and wants to keep them in.  Easy LMA insertion, atraumatic. ? ? ? ? ?

## 2021-11-22 NOTE — Discharge Instructions (Addendum)
[facility] ?Office Phone Number [phone] ? ?BREAST BIOPSY/ PARTIAL MASTECTOMY: POST OP INSTRUCTIONS ? ?Always review your discharge instruction sheet given to you by the facility where your surgery was performed. ? ?IF YOU HAVE DISABILITY OR FAMILY LEAVE FORMS, YOU MUST BRING THEM TO THE OFFICE FOR PROCESSING.  DO NOT GIVE THEM TO YOUR DOCTOR. ? ?A prescription for pain medication may be given to you upon discharge.  Take your pain medication as prescribed, if needed.  If narcotic pain medicine is not needed, then you may take acetaminophen (Tylenol) or ibuprofen (Advil) as needed. ?Take your usually prescribed medications unless otherwise directed ?If you need a refill on your pain medication, please contact your pharmacy.  They will contact our office to request authorization.  Prescriptions will not be filled after 5pm or on week-ends. ?You should eat very light the first 24 hours after surgery, such as soup, crackers, pudding, etc.  Resume your normal diet the day after surgery. ?Most patients will experience some swelling and bruising in the breast.  Ice packs and a good support bra will help.  Swelling and bruising can take several days to resolve.  ?It is common to experience some constipation if taking pain medication after surgery.  Increasing fluid intake and taking a stool softener will usually help or prevent this problem from occurring.  A mild laxative (Milk of Magnesia or Miralax) should be taken according to package directions if there are no bowel movements after 48 hours. ?Unless discharge instructions indicate otherwise, you may remove your bandages 24-48 hours after surgery, and you may shower at that time.  You may have steri-strips (small skin tapes) in place directly over the incision.  These strips should be left on the skin for 7-10 days.  If your surgeon used skin glue on the incision, you may shower in 24 hours.  The glue will flake off over the next 2-3 weeks.  Any  sutures or staples will be removed at the office during your follow-up visit. ?ACTIVITIES:  You may resume regular daily activities (gradually increasing) beginning the next day.  Wearing a good support bra or sports bra minimizes pain and swelling.  You may have sexual intercourse when it is comfortable. ?You may drive when you no longer are taking prescription pain medication, you can comfortably wear a seatbelt, and you can safely maneuver your car and apply brakes. ?RETURN TO WORK:  ______________________________________________________________________________________ ?You should see your doctor in the office for a follow-up appointment approximately two weeks after your surgery.  Your doctor?s nurse will typically make your follow-up appointment when she calls you with your pathology report.  Expect your pathology report 2-3 business days after your surgery.  You may call to check if you do not hear from [place] after three days. ?OTHER INSTRUCTIONS: _______________________________________________________________________________________________ _____________________________________________________________________________________________________________________________________ ?_____________________________________________________________________________________________________________________________________ ?_____________________________________________________________________________________________________________________________________ ? ?WHEN TO CALL YOUR DOCTOR: ?Fever over 101.0 ?Nausea and/or vomiting. ?Extreme swelling or bruising. ?Continued bleeding from incision. ?Increased pain, redness, or drainage from the incision. ? ?The clinic staff is available to answer your questions during regular business hours.  Please don?t hesitate to call and ask to speak to one of the nurses for clinical concerns.  If you have a medical emergency, go to the nearest emergency room or call 911.  A surgeon from [facility] is always on call at the hospital. ? ?For further questions, please visit [email or web address]   ?Post Anesthesia Home Care Instructions ? ?Activity: ?Get plenty of rest for the remainder of the  day. A responsible individual must stay with you for 24 hours following the procedure.  ?For the next 24 hours, DO NOT: ?-Drive a car ?-Paediatric nurse ?-Drink alcoholic beverages ?-Take any medication unless instructed by your physician ?-Make any legal decisions or sign important papers. ? ?Meals: ?Start with liquid foods such as gelatin or soup. Progress to regular foods as tolerated. Avoid greasy, spicy, heavy foods. If nausea and/or vomiting occur, drink only clear liquids until the nausea and/or vomiting subsides. Call your physician if vomiting continues. ? ?Special Instructions/Symptoms: ?Your throat may feel dry or sore from the anesthesia or the breathing tube placed in your throat during surgery. If this causes discomfort, gargle with warm salt water. The discomfort should disappear within 24 hours. ? ?If you had a scopolamine patch placed behind your ear for the management of post- operative nausea and/or vomiting: ? ?1. The medication in the patch is effective for 72 hours, after which it should be removed.  Wrap patch in a tissue and discard in the trash. Wash hands thoroughly with soap and water. ?2. You may remove the patch earlier than 72 hours if you experience unpleasant side effects which may include dry mouth, dizziness or visual disturbances. ?3. Avoid touching the patch. Wash your hands with soap and water after contact with the patch. ?   Next tylenol dose at 12pm  ?

## 2021-11-22 NOTE — Transfer of Care (Signed)
Immediate Anesthesia Transfer of Care Note ? ?Patient: Brianna Conigliaro ? ?Procedure(s) Performed: RIGHT BREAST LUMPECTOMY WITH RADIOACTIVE SEED LOCALIZATION X2 (Right: Breast) ? ?Patient Location: PACU ? ?Anesthesia Type:General ? ?Level of Consciousness: awake, alert  and oriented ? ?Airway & Oxygen Therapy: Patient Spontanous Breathing and Patient connected to face mask oxygen ? ?Post-op Assessment: Report given to RN and Post -op Vital signs reviewed and stable ? ?Post vital signs: Reviewed and stable ? ?Last Vitals:  ?Vitals Value Taken Time  ?BP    ?Temp    ?Pulse 90 11/22/21 0828  ?Resp 11 11/22/21 0828  ?SpO2 100 % 11/22/21 0828  ?Vitals shown include unvalidated device data. ? ?Last Pain:  ?Vitals:  ? 11/22/21 0635  ?TempSrc: Oral  ?PainSc: 0-No pain  ?   ? ?Patients Stated Pain Goal: 5 (11/22/21 2841) ? ?Complications: No notable events documented. ?

## 2021-11-22 NOTE — Interval H&P Note (Signed)
History and Physical Interval Note: ? ?11/22/2021 ?7:19 AM ? ?Morgan [name]  has presented today for surgery, with the diagnosis of RIGHT BREAST MASS.  The various methods of treatment have been discussed with the patient and family. After consideration of risks, benefits and other options for treatment, the patient has consented to  Procedure(s): ?RIGHT BREAST LUMPECTOMY WITH RADIOACTIVE SEED LOCALIZATION X2 (Right) as a surgical intervention.  The patient's history has been reviewed, patient examined, no change in status, stable for surgery.  I have reviewed the patient's chart and labs.  Questions were answered to the patient's satisfaction.   ? ? ?Mackie Goon A Lenoria Narine ? ? ?

## 2021-11-22 NOTE — Op Note (Signed)
Preoperative diagnosis: Right breast masses consistent with fibroadenoma symptomatic lower outer quadrant ? ?Postoperative diagnosis: Same ? ?Procedure: Right breast seed localized lumpectomy using 2 seeds and a bracketed approach ? ?Surgeon: Erroll Luna, MD ? ?Anesthesia: LMA with 0.25% Marcaine plain ? ?EBL: Minimal ? ?Drains: None ? ?Specimen: Right breast tissue with 2 seeds verified by the Faxitron.  Clip was present as well. ? ?Drains: None ? ?IV fluids: Per anesthesia record ? 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  These were biopsied back in July 2021.  These are getting larger and causing pain and she desired excision due to pain.  Risks and benefits of surgery discussed.  Potential cosmetic issues discussed as well.  Observation discussed the natural history of fibroadenoma discussed.The procedure has been discussed with the patient. Alternatives to surgery have been discussed with the patient.  Risks of surgery include bleeding,  Infection,  Seroma formation, death,  cosmesis  and the need for further surgery.   The patient understands and wishes to proceed.  ? ? ?Description of procedure: The patient was met in the holding area and questions were answered.  The right breast was marked as the correct site and films were available for review.  She was taken back to the operating room.  She was placed upon upon the OR table.  After induction of LMA anesthesia, right breast was prepped and draped in a sterile fashion timeout performed.  Films were available for review in the operating room.  Proper patient, site and procedure verified.  The seeds are identified with the neoprobe and marked on the skin surface at the 8 o'clock position right breast.  Curvilinear incision was made in between the 2 signals.  Dissection was carried toward the nipple and then more lateral to excise the entire region was bracketed by the 2 seeds which  corresponded to the fibroadenoma.  There are multiple fibroadenoma within this area.  These were excised without difficulty.  Hemostasis achieved with cautery.  Irrigation used.  Local anesthetic infiltrated throughout the cavity.  Deep layers of the breast were closed with 3-0 Vicryl.  4 Monocryl was used to close the skin in a subcuticular fashion.  Dermabond applied.  Breast binder placed.  All counts were found to be correct.  The patient was awoke extubated taken to recovery in satisfactory condition. ?

## 2021-11-22 NOTE — Anesthesia Postprocedure Evaluation (Signed)
Anesthesia Post Note ? ?Patient: Demri Hoxworth ? ?Procedure(s) Performed: RIGHT BREAST LUMPECTOMY WITH RADIOACTIVE SEED LOCALIZATION X2 (Right: Breast) ? ?  ? ?Patient location during evaluation: PACU ?Anesthesia Type: General ?Level of consciousness: awake and alert, oriented and patient cooperative ?Pain management: pain level controlled ?Vital Signs Assessment: post-procedure vital signs reviewed and stable ?Respiratory status: spontaneous breathing, nonlabored ventilation and respiratory function stable ?Cardiovascular status: blood pressure returned to baseline and stable ?Postop Assessment: no apparent nausea or vomiting ?Anesthetic complications: no ? ? ?No notable events documented. ? ?Last Vitals:  ?Vitals:  ? 11/22/21 0848 11/22/21 0857  ?BP:  124/80  ?Pulse:  71  ?Resp:  12  ?Temp:    ?SpO2: 99% 98%  ?  ?Last Pain:  ?Vitals:  ? 11/22/21 0848  ?TempSrc:   ?PainSc: 3   ? ? ?  ?  ?  ?  ?  ?  ? ?Jarome Matin Mouna Yager ? ? ? ? ?

## 2021-11-26 ENCOUNTER — Encounter (HOSPITAL_BASED_OUTPATIENT_CLINIC_OR_DEPARTMENT_OTHER): Payer: Self-pay | Admitting: Surgery

## 2021-11-26 LAB — SURGICAL PATHOLOGY

## 2021-11-27 ENCOUNTER — Encounter: Payer: Self-pay | Admitting: Surgery

## 2021-12-06 ENCOUNTER — Encounter (HOSPITAL_COMMUNITY): Payer: Self-pay

## 2021-12-06 DIAGNOSIS — R112 Nausea with vomiting, unspecified: Secondary | ICD-10-CM | POA: Diagnosis not present

## 2021-12-14 ENCOUNTER — Encounter: Payer: Self-pay | Admitting: *Deleted

## 2021-12-18 ENCOUNTER — Encounter: Payer: Self-pay | Admitting: Internal Medicine

## 2021-12-18 ENCOUNTER — Ambulatory Visit: Payer: BC Managed Care – PPO | Admitting: Internal Medicine

## 2021-12-18 ENCOUNTER — Other Ambulatory Visit: Payer: Medicaid Other

## 2021-12-18 VITALS — BP 100/64 | HR 92 | Ht 59.25 in | Wt 87.0 lb

## 2021-12-18 DIAGNOSIS — R1013 Epigastric pain: Secondary | ICD-10-CM

## 2021-12-18 DIAGNOSIS — R634 Abnormal weight loss: Secondary | ICD-10-CM

## 2021-12-18 DIAGNOSIS — Z8379 Family history of other diseases of the digestive system: Secondary | ICD-10-CM

## 2021-12-18 DIAGNOSIS — R112 Nausea with vomiting, unspecified: Secondary | ICD-10-CM | POA: Diagnosis not present

## 2021-12-18 MED ORDER — ONDANSETRON 4 MG PO TBDP
4.0000 mg | ORAL_TABLET | Freq: Four times a day (QID) | ORAL | 1 refills | Status: DC | PRN
Start: 2021-12-18 — End: 2023-07-17

## 2021-12-18 NOTE — Patient Instructions (Signed)
You have been scheduled for an endoscopy. Please follow written instructions given to you at your visit today. ?If you use inhalers (even only as needed), please bring them with you on the day of your procedure. ? ?You will need to come 1.5 hours EARLIER than the stated time on your endoscopy instructions to the lab on basement floor of [facility] building for HCG testing. ? ?We have requested labwork that was done on 12/06/21 at [facility]. ? ?If you are age 71 or older, your body mass index should be between 23-30. Your Body mass index is 17.42 kg/m?Marland Kitchen If this is out of the aforementioned range listed, please consider follow up with your Primary Care Provider. ? ?If you are age 66 or younger, your body mass index should be between 19-25. Your Body mass index is 17.42 kg/m?Marland Kitchen If this is out of the aformentioned range listed, please consider follow up with your Primary Care Provider.  ? ?________________________________________________________ ? ?The [facility] providers would like to encourage you to use [facility] to communicate with providers for non-urgent requests or questions.  Due to long hold times on the telephone, sending your provider a message by [facility] may be a faster and more efficient way to get a response.  Please allow 48 business hours for a response.  Please remember that this is for non-urgent requests.  ?_______________________________________________________ ? ?Due to recent changes in healthcare laws, you may see the results of your imaging and laboratory studies on MyChart before your provider has had a chance to review them.  We understand that in some cases there may be results that are confusing or concerning to you. Not all laboratory results come back in the same time frame and the provider may be waiting for multiple results in order to interpret others.  Please give [place] 48 hours in order for your provider to thoroughly review all the results before contacting the office for  clarification of your results.  ? ?

## 2021-12-19 ENCOUNTER — Encounter: Payer: Self-pay | Admitting: Internal Medicine

## 2021-12-19 NOTE — Progress Notes (Signed)
Patient ID: Morgan [name], female   DOB: 13-Jul-2001, 21 y.o.   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  She is here alone today. ? ?She reports that she has episodic nausea and vomiting.  Certainly worse with eating.  Can be 1 episode of vomiting and no further or can be vomiting lasting several hours or most of the day.  Zofran definitely helps.  Has some days which are good.  Estimates this occurs 4 days/week on average.  Almost always after eating.  She does have some early satiety.  She has occasional heartburn which she notices at bedtime after lying down.  This is not a daily problem.  No dysphagia.  Bowel movements are daily without blood or melena.  She does have some upper abdominal pain and discomfort.  No hematemesis.  Intermittent headaches.  Vomiting has been on and off for for 5 years.  Worse lately.  She has lost 5 to 10 pounds which made her feel nervous due to her relatively low weight at baseline. ? ?Gallbladder was removed before age 67 for gallstones/dysfunctional gallbladder. ? ?Recently finished doxycycline for skin infection after fibroadenoma removal. ? ?Sister has Crohn's disease at age 18.  Mother also has Crohn's disease.  She worries about having Crohn's herself. ? ?She smokes marijuana typically daily.  She vapes nicotine typically only socially. ? ?She is in school at [employer] and recently got a job at [employer]. ? ?She is sexually active and has had unprotected sex within the last month since her last menstrual period. ? ?Past Medical History:  ?Diagnosis Date  ? Anxiety   ? Depression   ? H/O cold sores   ? Migraines   ?  Seizures ([place]) 09/23/2017  ? No meds- possible seizure 03/09/20 did not f/u with neuro- no issues since.  ? ? ?Past Surgical History:  ?Procedure Laterality Date  ? BREAST LUMPECTOMY WITH RADIOACTIVE SEED LOCALIZATION Right 11/22/2021  ? Procedure: RIGHT BREAST LUMPECTOMY WITH RADIOACTIVE SEED LOCALIZATION X2;  Surgeon: Erroll Luna, MD;  Location: [facility];  Service: General;  Laterality: Right;  ? CHOLECYSTECTOMY    ? DENTAL RESTORATION/EXTRACTION WITH X-RAY    ? ? ?Outpatient Medications Prior to Visit  ?Medication Sig Dispense Refill  ? valACYclovir (VALTREX) 500 MG tablet Take 500 mg by mouth as needed (cold sores).    ? lidocaine (XYLOCAINE) 2 % solution Use as directed 15 mLs in the mouth or throat every 3 (three) hours as needed for mouth pain. 100 mL 0  ? oxyCODONE (OXY IR/ROXICODONE) 5 MG immediate release tablet Take 1 tablet (5 mg total) by mouth every 6 (six) hours as needed for severe pain. 15 tablet 0  ? ?No facility-administered medications prior to visit.  ? ? ?Allergies  ?Allergen Reactions  ? Aspirin Other (See Comments)  ?  Abdominal pain   ? Ethanol-Alcohol [Ethanol]   ?  intolerant  ? ? ?Family History  ?Problem Relation Age of Onset  ? Crohn's disease Mother   ? Crohn's disease Sister   ? Colon cancer Neg Hx   ? Esophageal cancer Neg Hx   ? ? ?Social History  ? ?Tobacco Use  ? Smoking status: Some Days  ?  Types: E-cigarettes  ? Smokeless tobacco: Never  ? Tobacco comments:  ?  social smoker   ?Vaping Use  ? Vaping Use: Some days  ?Substance Use Topics  ? Alcohol use: Never  ? Drug use: Yes  ?  Frequency: 3.0 times per week  ?  Types: Marijuana  ? ? ?ROS: ?As per history of present illness, otherwise negative ? ?BP 100/64   Pulse 92   Ht 4' 11.25" (1.505 m)   Wt 87 lb (39.5 kg)   LMP  (LMP Unknown) Comment: irregular  BMI 17.42 kg/m?  ?Constitutional: Well-developed and well-nourished. No distress. ?HEENT: Normocephalic and atraumatic. No scleral icterus. ?Cardiovascular:  Normal rate, regular rhythm and intact distal pulses. No M/R/G ?Pulmonary/chest: Effort normal and breath sounds normal. No wheezing, rales or rhonchi. ?Abdominal: Soft, nontender, nondistended. Thin.  Bowel sounds active throughout. There are no masses palpable. No hepatosplenomegaly. ?Extremities: no clubbing, cyanosis, or edema ?Neurological: Alert and oriented to person place and time. ?Skin: Skin is warm and dry.  ?Psychiatric: Normal mood and affect. Behavior is normal. ? ?RELEVANT LABS AND IMAGING: ?Lab work from [facility] received and reviewed; lab work dated 12/06/2021: ?CMP within normal limits; albumin 5.0, AST 17, ALT 12, total bili 0.4, alk phos 77 ?TSH 2.04 ?Creatinine 0.91, BUN 14 ?Glucose 99 White count 7.9, hemoglobin 14.1, MCV 91.6, platelets 316 ? ? 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  ? ?N/V/epigastric pain --I discussed her symptoms at length.  I am suspicious for cannabinoid hyperemesis, though cyclic vomiting syndrome is in the differential.  Ulcer disease in the differential, H. pylori, celiac, and Crohn's disease (particularly in light of strong family history of the same) I recommended the following work-up: ?--EGD; we reviewed the risk, benefits and alternatives and she is agreeable and wishes to proceed ?--Fecal calprotectin ?--Zofran ODT 4 mg every 6 hours as needed nausea/vomiting ?--Marijuana cessation for 6 to 8 weeks to see if symptoms improve ?--If the above work-up unrevealing consider cross-sectional imaging with CT scan abdomen pelvis with contrast ? ?2.  Family history of Crohn's disease in mother and sister --see  above ? ? ? ? ? ?BR:[facility], [address] [address] ?[address] ?[place],  [place] [address] ? ? ?

## 2021-12-27 DIAGNOSIS — J069 Acute upper respiratory infection, unspecified: Secondary | ICD-10-CM | POA: Diagnosis not present

## 2021-12-27 DIAGNOSIS — Z1159 Encounter for screening for other viral diseases: Secondary | ICD-10-CM | POA: Diagnosis not present

## 2022-01-25 ENCOUNTER — Inpatient Hospital Stay: Admission: RE | Admit: 2022-01-25 | Payer: Medicaid Other | Source: Ambulatory Visit

## 2022-02-01 ENCOUNTER — Encounter: Payer: Medicaid Other | Admitting: Internal Medicine

## 2022-02-01 ENCOUNTER — Telehealth: Payer: Self-pay | Admitting: Internal Medicine

## 2022-02-01 NOTE — Telephone Encounter (Signed)
Called patient -- went straight to voicemail. Left message for patient to call me back on my direct number. ?

## 2022-02-01 NOTE — Telephone Encounter (Signed)
Dr Hilarie Fredrickson,  ? ?This patient just called back stating she tried calling several times within the last few days to reschedule but could not get thru.   ? ?She did rescheduled to June 6 '@4'$ :00 pm  ?

## 2022-02-26 ENCOUNTER — Telehealth: Payer: Self-pay | Admitting: Internal Medicine

## 2022-02-26 ENCOUNTER — Encounter: Payer: Medicaid Other | Admitting: Internal Medicine

## 2022-02-26 NOTE — Telephone Encounter (Signed)
Good  Afternoon Dr. Hilarie Fredrickson,  Patients mother called stating that patient had got a call to see if she could come in sooner for her procedure with you today at 4:00. Patients mother stated " she will not be able to come in early today and she is not coming to her appointment at 4:00, she will have to call back at a later time to reschedule". Patients mother then hung up.

## 2022-06-04 DIAGNOSIS — Z20822 Contact with and (suspected) exposure to covid-19: Secondary | ICD-10-CM | POA: Diagnosis not present

## 2022-06-06 DIAGNOSIS — J029 Acute pharyngitis, unspecified: Secondary | ICD-10-CM | POA: Diagnosis not present

## 2022-06-07 DIAGNOSIS — J029 Acute pharyngitis, unspecified: Secondary | ICD-10-CM | POA: Diagnosis not present

## 2022-09-09 DIAGNOSIS — U071 COVID-19: Secondary | ICD-10-CM | POA: Diagnosis not present

## 2022-09-24 DIAGNOSIS — R300 Dysuria: Secondary | ICD-10-CM | POA: Diagnosis not present

## 2022-09-25 DIAGNOSIS — Z113 Encounter for screening for infections with a predominantly sexual mode of transmission: Secondary | ICD-10-CM | POA: Diagnosis not present

## 2022-09-25 DIAGNOSIS — R81 Glycosuria: Secondary | ICD-10-CM | POA: Diagnosis not present

## 2022-09-26 ENCOUNTER — Emergency Department (HOSPITAL_COMMUNITY)
Admission: EM | Admit: 2022-09-26 | Discharge: 2022-09-26 | Discharge status: Against Medical Advice | Payer: BC Managed Care – PPO | Attending: Emergency Medicine | Admitting: Emergency Medicine

## 2022-09-26 ENCOUNTER — Other Ambulatory Visit: Payer: Self-pay

## 2022-09-26 DIAGNOSIS — E1065 Type 1 diabetes mellitus with hyperglycemia: Secondary | ICD-10-CM | POA: Diagnosis not present

## 2022-09-26 DIAGNOSIS — Z5321 Procedure and treatment not carried out due to patient leaving prior to being seen by health care provider: Secondary | ICD-10-CM | POA: Insufficient documentation

## 2022-09-26 DIAGNOSIS — E1165 Type 2 diabetes mellitus with hyperglycemia: Secondary | ICD-10-CM | POA: Diagnosis not present

## 2022-09-26 LAB — BASIC METABOLIC PANEL
Anion gap: 8 (ref 5–15)
BUN: 18 mg/dL (ref 6–20)
CO2: 28 mmol/L (ref 22–32)
Calcium: 9.8 mg/dL (ref 8.9–10.3)
Chloride: 100 mmol/L (ref 98–111)
Creatinine, Ser: 0.75 mg/dL (ref 0.44–1.00)
GFR, Estimated: >60 mL/min (ref >60)
Glucose, Bld: 239 mg/dL — ABNORMAL HIGH (ref 70–99)
Potassium: 3.9 mmol/L (ref 3.5–5.1)
Sodium: 136 mmol/L (ref 135–145)

## 2022-09-26 LAB — CBC WITH DIFFERENTIAL/PLATELET
Abs Immature Granulocytes: 0.02 10*3/uL (ref 0.00–0.07)
Basophils Absolute: 0.1 10*3/uL (ref 0.0–0.1)
Basophils Relative: 1 %
Eosinophils Absolute: 0.3 10*3/uL (ref 0.0–0.5)
Eosinophils Relative: 4 %
HCT: 39 % (ref 36.0–46.0)
Hemoglobin: 13.9 g/dL (ref 12.0–15.0)
Immature Granulocytes: 0 %
Lymphocytes Relative: 22 %
Lymphs Abs: 1.8 10*3/uL (ref 0.7–4.0)
MCH: 32.6 pg (ref 26.0–34.0)
MCHC: 35.6 g/dL (ref 30.0–36.0)
MCV: 91.3 fL (ref 80.0–100.0)
Monocytes Absolute: 0.7 10*3/uL (ref 0.1–1.0)
Monocytes Relative: 9 %
Neutro Abs: 5.3 10*3/uL (ref 1.7–7.7)
Neutrophils Relative %: 64 %
Platelets: 272 10*3/uL (ref 150–400)
RBC: 4.27 MIL/uL (ref 3.87–5.11)
RDW: 11.8 % (ref 11.5–15.5)
WBC: 8.2 10*3/uL (ref 4.0–10.5)
nRBC: 0 % (ref 0.0–0.2)

## 2022-09-26 LAB — CBG MONITORING, ED: Glucose-Capillary: 228 mg/dL — ABNORMAL HIGH (ref 70–99)

## 2022-09-26 NOTE — ED Provider Triage Note (Signed)
Emergency Medicine Provider Triage Evaluation Note  Morgan [name] , [name] 22 y.o. female  was evaluated in triage.  Pt complains of hyperglycemia onset today. Was just diagnosed with type 1 DM 2 days ago. No insulin at this time. Checked her CBG at home and it was 357. Has associated polyuria, hematuria, increased thirst, resolving dizziness, fatigue, blurred vision. Denies dysuria.   Review of Systems  Positive:  Negative:   Physical Exam  BP 138/86 (BP Location: Right Arm)   Pulse 84   Temp 98.4 F (36.9 C) (Oral)   Resp 19   SpO2 99%  Gen:   Awake, no distress   Resp:  Normal effort  MSK:   Moves extremities without difficulty  Other:    Medical Decision Making  Medically screening exam initiated at 8:00 PM.  Appropriate orders placed.  Morgan [name] was informed that the remainder of the evaluation will be completed by another provider, this initial triage assessment does not replace that evaluation, and the importance of remaining in the ED until their evaluation is complete.  8:00 PM - CBG 228 in triage   Morgan Napp A, PA-C 09/26/22 2000

## 2022-09-26 NOTE — ED Notes (Signed)
Pt called for room x2 with no answer. Pt seen leaving the ED approx. 30 mins prior and has not returned.

## 2022-09-26 NOTE — ED Triage Notes (Signed)
Patient reports elevated blood sugar this evening CBG= 357 with mild blurred vision and lightheadedness.

## 2022-10-01 DIAGNOSIS — E1065 Type 1 diabetes mellitus with hyperglycemia: Secondary | ICD-10-CM | POA: Diagnosis not present

## 2022-10-01 DIAGNOSIS — G40309 Generalized idiopathic epilepsy and epileptic syndromes, not intractable, without status epilepticus: Secondary | ICD-10-CM | POA: Diagnosis not present

## 2022-10-02 ENCOUNTER — Emergency Department (HOSPITAL_COMMUNITY)
Admission: EM | Admit: 2022-10-02 | Discharge: 2022-10-02 | Discharge status: Against Medical Advice | Payer: Medicaid Other

## 2022-10-02 NOTE — ED Notes (Signed)
No answer for triage x3

## 2022-10-02 NOTE — ED Notes (Signed)
Pt not answering for triage

## 2022-10-02 NOTE — ED Notes (Addendum)
No answer for triage.

## 2022-10-10 DIAGNOSIS — E1065 Type 1 diabetes mellitus with hyperglycemia: Secondary | ICD-10-CM | POA: Diagnosis not present

## 2022-10-16 DIAGNOSIS — E1065 Type 1 diabetes mellitus with hyperglycemia: Secondary | ICD-10-CM | POA: Diagnosis not present

## 2022-10-25 DIAGNOSIS — E1065 Type 1 diabetes mellitus with hyperglycemia: Secondary | ICD-10-CM | POA: Diagnosis not present

## 2022-10-25 DIAGNOSIS — G40309 Generalized idiopathic epilepsy and epileptic syndromes, not intractable, without status epilepticus: Secondary | ICD-10-CM | POA: Diagnosis not present

## 2022-10-31 ENCOUNTER — Emergency Department (HOSPITAL_BASED_OUTPATIENT_CLINIC_OR_DEPARTMENT_OTHER)
Admission: EM | Admit: 2022-10-31 | Discharge: 2022-10-31 | Discharge status: Against Medical Advice | Payer: BC Managed Care – PPO | Attending: Emergency Medicine | Admitting: Emergency Medicine

## 2022-10-31 ENCOUNTER — Other Ambulatory Visit: Payer: Self-pay

## 2022-10-31 ENCOUNTER — Encounter (HOSPITAL_BASED_OUTPATIENT_CLINIC_OR_DEPARTMENT_OTHER): Payer: Self-pay | Admitting: Emergency Medicine

## 2022-10-31 DIAGNOSIS — Z5321 Procedure and treatment not carried out due to patient leaving prior to being seen by health care provider: Secondary | ICD-10-CM | POA: Diagnosis not present

## 2022-10-31 DIAGNOSIS — E1065 Type 1 diabetes mellitus with hyperglycemia: Secondary | ICD-10-CM | POA: Diagnosis not present

## 2022-10-31 DIAGNOSIS — R739 Hyperglycemia, unspecified: Secondary | ICD-10-CM | POA: Diagnosis not present

## 2022-10-31 LAB — CBG MONITORING, ED: Glucose-Capillary: 294 mg/dL — ABNORMAL HIGH (ref 70–99)

## 2022-10-31 NOTE — ED Triage Notes (Signed)
Pt states she was recently diagnosed with type 1 diabetes in Jan 2024 and states her blood sugar is elevated and has been reading in the 300s tonight for several hours

## 2022-11-07 DIAGNOSIS — E1065 Type 1 diabetes mellitus with hyperglycemia: Secondary | ICD-10-CM | POA: Diagnosis not present

## 2022-11-22 ENCOUNTER — Encounter (HOSPITAL_COMMUNITY): Payer: Self-pay

## 2022-11-22 ENCOUNTER — Other Ambulatory Visit: Payer: Self-pay

## 2022-11-22 ENCOUNTER — Emergency Department (HOSPITAL_COMMUNITY)
Admission: EM | Admit: 2022-11-22 | Discharge: 2022-11-22 | Disposition: A | Discharge status: Home / Self Care | Payer: BC Managed Care – PPO | Attending: Emergency Medicine | Admitting: Emergency Medicine

## 2022-11-22 DIAGNOSIS — Z794 Long term (current) use of insulin: Secondary | ICD-10-CM | POA: Diagnosis not present

## 2022-11-22 DIAGNOSIS — E162 Hypoglycemia, unspecified: Secondary | ICD-10-CM

## 2022-11-22 DIAGNOSIS — E10649 Type 1 diabetes mellitus with hypoglycemia without coma: Secondary | ICD-10-CM | POA: Insufficient documentation

## 2022-11-22 DIAGNOSIS — E11649 Type 2 diabetes mellitus with hypoglycemia without coma: Secondary | ICD-10-CM | POA: Diagnosis not present

## 2022-11-22 LAB — CBC
HCT: 37.2 % (ref 36.0–46.0)
Hemoglobin: 12.5 g/dL (ref 12.0–15.0)
MCH: 32 pg (ref 26.0–34.0)
MCHC: 33.6 g/dL (ref 30.0–36.0)
MCV: 95.1 fL (ref 80.0–100.0)
Platelets: 265 10*3/uL (ref 150–400)
RBC: 3.91 MIL/uL (ref 3.87–5.11)
RDW: 12 % (ref 11.5–15.5)
WBC: 9.9 10*3/uL (ref 4.0–10.5)
nRBC: 0 % (ref 0.0–0.2)

## 2022-11-22 LAB — COMPREHENSIVE METABOLIC PANEL
ALT: 12 U/L (ref 0–44)
AST: 23 U/L (ref 15–41)
Albumin: 3.7 g/dL (ref 3.5–5.0)
Alkaline Phosphatase: 60 U/L (ref 38–126)
Anion gap: 6 (ref 5–15)
BUN: 12 mg/dL (ref 6–20)
CO2: 24 mmol/L (ref 22–32)
Calcium: 8.3 mg/dL — ABNORMAL LOW (ref 8.9–10.3)
Chloride: 104 mmol/L (ref 98–111)
Creatinine, Ser: 0.83 mg/dL (ref 0.44–1.00)
GFR, Estimated: >60 mL/min (ref >60)
Glucose, Bld: 376 mg/dL — ABNORMAL HIGH (ref 70–99)
Potassium: 3.6 mmol/L (ref 3.5–5.1)
Sodium: 134 mmol/L — ABNORMAL LOW (ref 135–145)
Total Bilirubin: 0.3 mg/dL (ref 0.3–1.2)
Total Protein: 6.4 g/dL — ABNORMAL LOW (ref 6.5–8.1)

## 2022-11-22 LAB — I-STAT BETA HCG BLOOD, ED (MC, WL, AP ONLY): I-stat hCG, quantitative: <5 m[IU]/mL (ref <5)

## 2022-11-22 LAB — CBG MONITORING, ED
Glucose-Capillary: 140 mg/dL — ABNORMAL HIGH (ref 70–99)
Glucose-Capillary: 399 mg/dL — ABNORMAL HIGH (ref 70–99)
Glucose-Capillary: 348 mg/dL — ABNORMAL HIGH (ref 70–99)

## 2022-11-22 MED ORDER — SODIUM CHLORIDE 0.9% FLUSH
3.0000 mL | Freq: Two times a day (BID) | INTRAVENOUS | Status: DC
  Administered 2022-11-22: 3 mL via INTRAVENOUS

## 2022-11-22 MED ORDER — SODIUM CHLORIDE 0.9% FLUSH: 3.0000 mL | INTRAVENOUS | Status: DC | PRN

## 2022-11-22 MED ORDER — SODIUM CHLORIDE 0.9 % IV SOLN
250.0000 mL | INTRAVENOUS | Status: DC | PRN
  Administered 2022-11-22: 250 mL via INTRAVENOUS

## 2022-11-22 MED ORDER — SODIUM CHLORIDE 0.9 % IV BOLUS
1000.0000 mL | Freq: Once | INTRAVENOUS | Status: AC
  Administered 2022-11-22: 1000 mL via INTRAVENOUS

## 2022-11-22 NOTE — ED Notes (Signed)
Pt CBG 399. MD aware.

## 2022-11-22 NOTE — ED Provider Notes (Signed)
[facility] Provider Note   CSN: [id number] Arrival date & time: 11/22/22  [id number]     History  Chief Complaint  Patient presents with   Hypoglycemia    Morgan [name] is a 22 y.o. female.  HPI 22 year old female with a recent diagnosis of type 1 diabetes presents with hypoglycemia.  Around 8 AM she took 3 units of her Humalog, which she takes according to a sliding scale.  Her glucose was in the mid 200s prior to this on her Dexcom.  She then ate a [employer] breakfast which included a Coke.  However around 30 minutes later she started feeling she is going to pass out and realized her glucose was low.  It got as low as the 30s.  EMS gave her oral glucose packet and now she is feeling better.  She has not been feeling sick recently.  She has been following with endocrinology and having different medication regimens adjusted due to trouble controlling her glucose.  She has not yet taken her long-acting, states she normally takes at around noon.  Home Medications Prior to Admission medications   Medication Sig Start Date End Date Taking? Authorizing Provider  ondansetron (ZOFRAN-ODT) 4 MG disintegrating tablet Take 1 tablet (4 mg total) by mouth every 6 (six) hours as needed for nausea or vomiting. 12/18/21   Pyrtle, Lajuan Lines, MD  valACYclovir (VALTREX) 500 MG tablet Take 500 mg by mouth as needed (cold sores).    [provider]      Allergies    Aspirin and Ethanol-alcohol [ethanol]    Review of Systems   Review of Systems  Constitutional:  Negative for fever.  HENT:  Negative for congestion.   Respiratory:  Negative for cough.   Gastrointestinal:  Negative for diarrhea and vomiting.  Genitourinary:  Negative for dysuria.    Physical Exam Updated Vital Signs BP 119/70 (BP Location: Right Arm)   Pulse 78   Temp 97.8 F (36.6 C) (Oral)   Resp 14   Ht '4\' 11"'$  (1.499 m)   Wt 38.1 kg   SpO2 100%   BMI 16.97 kg/m  Physical  Exam Vitals and nursing note reviewed.  Constitutional:      General: She is not in acute distress.    Appearance: She is well-developed. She is not ill-appearing or diaphoretic.  HENT:     Head: Normocephalic and atraumatic.  Cardiovascular:     Rate and Rhythm: Normal rate and regular rhythm.     Heart sounds: Normal heart sounds.  Pulmonary:     Effort: Pulmonary effort is normal.     Breath sounds: Normal breath sounds.  Abdominal:     General: There is no distension.     Palpations: Abdomen is soft.     Tenderness: There is no abdominal tenderness.  Skin:    General: Skin is warm and dry.  Neurological:     Mental Status: She is alert.     ED Results / Procedures / Treatments   Labs (all labs ordered are listed, but only abnormal results are displayed) Labs Reviewed  COMPREHENSIVE METABOLIC PANEL - Abnormal; Notable for the following components:      Result Value   Sodium 134 (*)    Glucose, Bld 376 (*)    Calcium 8.3 (*)    Total Protein 6.4 (*)    All other components within normal limits  CBG MONITORING, ED - Abnormal; Notable for the following components:  Glucose-Capillary 140 (*)    All other components within normal limits  CBG MONITORING, ED - Abnormal; Notable for the following components:   Glucose-Capillary 399 (*)    All other components within normal limits  CBG MONITORING, ED - Abnormal; Notable for the following components:   Glucose-Capillary 348 (*)    All other components within normal limits  CBC  I-STAT BETA HCG BLOOD, ED ([facility], [facility], [facility] ONLY)  CBG MONITORING, ED    EKG None  Radiology No results found.  Procedures Procedures    Medications Ordered in ED Medications  sodium chloride flush (NS) 0.9 % injection 3 mL (3 mLs Intravenous Given 11/22/22 1014)  sodium chloride flush (NS) 0.9 % injection 3 mL (has no administration in time range)  0.9 %  sodium chloride infusion (0 mLs Intravenous Stopped 11/22/22 1338)  sodium chloride 0.9 %  bolus 1,000 mL (0 mLs Intravenous Stopped 11/22/22 1338)    ED Course/ Medical Decision Making/ A&P                             Medical Decision Making Amount and/or Complexity of Data Reviewed External Data Reviewed: notes. Labs: ordered.    Details: Mild hyperglycemia that has slowly worsened up to 399 and now is mildly improving.  No acidosis.  Normal WBC.  Risk Prescription drug management.   Patient presents with an episode of hypoglycemia.  Seems like the insulin likely works prior to her meal taking effect and has now that she has had oral glucose and time her glucose is now significantly higher and almost up to 400.  I do not think this is all from the oral glucose from EMS.  Discussed that what she need to be careful with her insulin, she will need to start her insulin again including her long-acting that she is supposed to take around noon.  Otherwise, she does not appear to be in DKA.  She appears stable for discharge and I encouraged her to call her endocrinologist to see if any medication adjustments are desired.  Will give return precautions.        Final Clinical Impression(s) / ED Diagnoses Final diagnoses:  Hypoglycemia    Rx / DC Orders ED Discharge Orders     None         Sherwood Gambler, MD 11/22/22 1340

## 2022-11-22 NOTE — Discharge Instructions (Signed)
Call your diabetes doctor today for further instructions on how to adjust your insulin.  Be sure you are adhering to a diabetic diet.  If you develop recurrent symptoms or any other new/concerning symptoms then return to the ER or call 911.

## 2022-11-22 NOTE — ED Triage Notes (Signed)
Pt BIBA from home with c/o hypoglycemia. CBG dropped to 46. Recent dx of type 1 diabetes and on sliding scale with insulin. She felt dizziness and nauseous and started to eat candy and drink OJ. No emesis noted.  CBG 99 BP 108/60 HR 90 RR 18 O2 99% RA

## 2022-12-25 DIAGNOSIS — E1065 Type 1 diabetes mellitus with hyperglycemia: Secondary | ICD-10-CM | POA: Diagnosis not present

## 2023-03-01 ENCOUNTER — Ambulatory Visit (HOSPITAL_COMMUNITY)
Admission: EM | Admit: 2023-03-01 | Discharge: 2023-03-01 | Disposition: A | Discharge status: Home / Self Care | Payer: BC Managed Care – PPO

## 2023-03-01 ENCOUNTER — Encounter (HOSPITAL_COMMUNITY): Payer: Self-pay | Admitting: Emergency Medicine

## 2023-03-01 DIAGNOSIS — W540XXA Bitten by dog, initial encounter: Secondary | ICD-10-CM | POA: Diagnosis not present

## 2023-03-01 DIAGNOSIS — Z23 Encounter for immunization: Secondary | ICD-10-CM | POA: Diagnosis not present

## 2023-03-01 DIAGNOSIS — S61032A Puncture wound without foreign body of left thumb without damage to nail, initial encounter: Secondary | ICD-10-CM | POA: Diagnosis not present

## 2023-03-01 HISTORY — DX: Type 2 diabetes mellitus without complications: E11.9

## 2023-03-01 MED ORDER — TETANUS-DIPHTH-ACELL PERTUSSIS 5-2.5-18.5 LF-MCG/0.5 IM SUSY
0.5000 mL | PREFILLED_SYRINGE | Freq: Once | INTRAMUSCULAR | Status: AC
  Administered 2023-03-01: 0.5 mL via INTRAMUSCULAR

## 2023-03-01 MED ORDER — TETANUS-DIPHTH-ACELL PERTUSSIS 5-2.5-18.5 LF-MCG/0.5 IM SUSY
PREFILLED_SYRINGE | INTRAMUSCULAR | Status: AC
  Filled 2023-03-01: qty 0.5

## 2023-03-01 MED ORDER — AMOXICILLIN-POT CLAVULANATE 875-125 MG PO TABS: 1.0000 | ORAL_TABLET | Freq: Two times a day (BID) | ORAL | 0 refills | Status: DC

## 2023-03-01 MED ORDER — PREDNISONE 20 MG PO TABS: 40.0000 mg | ORAL_TABLET | Freq: Every day | ORAL | 0 refills | Status: DC

## 2023-03-01 NOTE — ED Triage Notes (Signed)
Pt reports that her dog and another dog were going at each other. Pt reports trying to get her dog away from other dog and she got bit on left thumb by her dog in the process. Pt has band aid over bit. Pt reports her dog is up to date on vaccinations.

## 2023-03-01 NOTE — Discharge Instructions (Signed)
Wound has been cleansed here in the office and covered with a nonstick Band-Aid, while at home cleanse with a antibacterial soap such as Dove and water, keep covered with a nonstick Band-Aid until healed to help keep from becoming dirty  Dog is are considered to be dirty and therefore you have been placed on antibiotic to keep area from becoming infected, take Augmentin every morning and every evening for 7 days  To help reduce swelling and for management of pain, take prednisone every morning with food for 5 days, you may take Tylenol in addition to this medicine as needed  Tetanus vaccine has been updated, good for 10 years  As the dog is your pet and up-to-date on vaccines you will not need rabies injections today  If you have any concerns regarding healing please follow-up with his urgent care for reevaluation

## 2023-03-01 NOTE — ED Provider Notes (Signed)
[facility]    CSN: [id number] Arrival date & time: 03/01/23  1217      History   Chief Complaint Chief Complaint  Patient presents with   Animal Bite    HPI Morgan [name] is a 22 y.o. female.   Presents for evaluation of dog bite to the left thumb that occurred today.  Offending dog is her pet, gotten the scuffle with another dog in the neighborhood.  Has complaints and applied Neosporin at home.  Painful to bend and numbness and tingling present at the site.  Bleeding Subsided.  Dog is up-to-date on vaccinations.  Unsure last tetanus.    Past Medical History:  Diagnosis Date   Anxiety    Depression    Diabetes mellitus without complication (HCC)    H/O cold sores    Migraines    Seizures (HCC) 09/23/2017   No meds- possible seizure 03/09/20 did not f/u with neuro- no issues since.    There are no problems to display for this patient.   Past Surgical History:  Procedure Laterality Date   BREAST LUMPECTOMY WITH RADIOACTIVE SEED LOCALIZATION Right 11/22/2021   Procedure: RIGHT BREAST LUMPECTOMY WITH RADIOACTIVE SEED LOCALIZATION X2;  Surgeon: Harriette Bouillon, MD;  Location: [facility];  Service: General;  Laterality: Right;   CHOLECYSTECTOMY     DENTAL RESTORATION/EXTRACTION WITH X-RAY      OB History   No obstetric history on file.      Home Medications    Prior to Admission medications   Medication Sig Start Date End Date Taking? Authorizing Provider  BD PEN NEEDLE NANO 2ND GEN 32G X 4 MM MISC Inject 1 Insert into the skin 4 (four) times daily. 02/15/23  Yes [provider]  Continuous Glucose Sensor (DEXCOM G7 SENSOR) MISC Inject 1 Implant into the skin every 14 (fourteen) days. 02/26/23  Yes [provider]  ondansetron (ZOFRAN-ODT) 4 MG disintegrating tablet Take 1 tablet (4 mg total) by mouth every 6 (six) hours as needed for nausea or vomiting. 12/18/21   Pyrtle, Carie Caddy, MD  valACYclovir (VALTREX) 500 MG tablet  Take 500 mg by mouth as needed (cold sores).    [provider]    Family History Family History  Problem Relation Age of Onset   Crohn's disease Mother    Diabetes Father    Crohn's disease Sister    Colon cancer Neg Hx    Esophageal cancer Neg Hx     Social History Social History   Tobacco Use   Smoking status: Some Days    Types: E-cigarettes   Smokeless tobacco: Never   Tobacco comments:    social smoker   Vaping Use   Vaping Use: Some days  Substance Use Topics   Alcohol use: Yes   Drug use: Yes    Frequency: 3.0 times per week    Types: Marijuana     Allergies   Aspirin and Ethanol-alcohol [ethanol]   Review of Systems Review of Systems   Physical Exam Triage Vital Signs ED Triage Vitals [03/01/23 1309]  Enc Vitals Group     BP 92/69     Pulse Rate 88     Resp 14     Temp 97.9 F (36.6 C)     Temp Source Oral     SpO2 100 %     Weight      Height      Head Circumference      Peak Flow  Pain Score 4     Pain Loc      Pain Edu?      Excl. in GC?    No data found.  Updated Vital Signs BP 92/69 (BP Location: Left Arm)   Pulse 88   Temp 97.9 F (36.6 C) (Oral)   Resp 14   SpO2 100%   Visual Acuity Right Eye Distance:   Left Eye Distance:   Bilateral Distance:    Right Eye Near:   Left Eye Near:    Bilateral Near:     Physical Exam Constitutional:      Appearance: Normal appearance.  Eyes:     Extraocular Movements: Extraocular movements intact.  Pulmonary:     Effort: Pulmonary effort is normal.  Skin:    Comments: Superficial puncture and 1 cm superficial laceration present to the middle phalanx of the left thumb along the palmar aspect, bleeding Subsided, mild to moderate swelling present, able to complete full range of motion, sensation intact, capillary refill less than 3  Neurological:     Mental Status: She is alert and oriented to person, place, and time. Mental status is at baseline.      UC Treatments  / Results  Labs (all labs ordered are listed, but only abnormal results are displayed) Labs Reviewed - No data to display  EKG   Radiology No results found.  Procedures Procedures (including critical care time)  Medications Ordered in UC Medications - No data to display  Initial Impression / Assessment and Plan / UC Course  I have reviewed the triage vital signs and the nursing notes.  Pertinent labs & imaging results that were available during my care of the patient were reviewed by me and considered in my medical decision making (see chart for details).  Wound of left thumb without foreign body without damage to nail, initial encounter, dog bite, initial encounter  Low suspicion for involvement of the tendon or joint, swelling is most likely the cause of difficulty with range of motion however able to complete full movement, prescribed Augmentin and prednisone, wound cleansed in office with chlorhexidine, Bactroban applied.  Dressing on advised daily cleansing with soap and water while at home and cover with a nonadherent dressing to prevent contamination, may continue activity as tolerated, offending animal is up-to-date on rabies therefore will not need today, tetanus booster given, may follow-up with his urgent care for any concerns regarding healing Final Clinical Impressions(s) / UC Diagnoses   Final diagnoses:  None   Discharge Instructions   None    ED Prescriptions   None    PDMP not reviewed this encounter.   Valinda Hoar, NP 03/01/23 1438

## 2023-05-30 ENCOUNTER — Other Ambulatory Visit: Payer: Self-pay

## 2023-05-30 ENCOUNTER — Emergency Department (HOSPITAL_COMMUNITY)
Admission: EM | Admit: 2023-05-30 | Discharge: 2023-05-31 | Disposition: A | Discharge status: Home / Self Care | Payer: Medicaid Other | Attending: Emergency Medicine | Admitting: Emergency Medicine

## 2023-05-30 ENCOUNTER — Encounter (HOSPITAL_COMMUNITY): Payer: Self-pay | Admitting: Emergency Medicine

## 2023-05-30 DIAGNOSIS — Z794 Long term (current) use of insulin: Secondary | ICD-10-CM | POA: Diagnosis not present

## 2023-05-30 DIAGNOSIS — E1065 Type 1 diabetes mellitus with hyperglycemia: Secondary | ICD-10-CM | POA: Insufficient documentation

## 2023-05-30 DIAGNOSIS — R109 Unspecified abdominal pain: Secondary | ICD-10-CM | POA: Diagnosis present

## 2023-05-30 DIAGNOSIS — R739 Hyperglycemia, unspecified: Secondary | ICD-10-CM

## 2023-05-30 LAB — URINALYSIS, ROUTINE W REFLEX MICROSCOPIC
Bacteria, UA: NONE SEEN
Bilirubin Urine: NEGATIVE
Glucose, UA: >=500 mg/dL — AB
Hgb urine dipstick: NEGATIVE
Ketones, ur: 20 mg/dL — AB
Leukocytes,Ua: NEGATIVE
Nitrite: NEGATIVE
Protein, ur: NEGATIVE mg/dL
Specific Gravity, Urine: 1.032 — ABNORMAL HIGH (ref 1.005–1.030)
pH: 5 (ref 5.0–8.0)

## 2023-05-30 LAB — COMPREHENSIVE METABOLIC PANEL
ALT: 16 U/L (ref 0–44)
AST: 20 U/L (ref 15–41)
Albumin: 4.5 g/dL (ref 3.5–5.0)
Alkaline Phosphatase: 86 U/L (ref 38–126)
Anion gap: 10 (ref 5–15)
BUN: 21 mg/dL — ABNORMAL HIGH (ref 6–20)
CO2: 22 mmol/L (ref 22–32)
Calcium: 9.4 mg/dL (ref 8.9–10.3)
Chloride: 101 mmol/L (ref 98–111)
Creatinine, Ser: 0.63 mg/dL (ref 0.44–1.00)
GFR, Estimated: >60 mL/min (ref >60)
Glucose, Bld: 262 mg/dL — ABNORMAL HIGH (ref 70–99)
Potassium: 3.5 mmol/L (ref 3.5–5.1)
Sodium: 133 mmol/L — ABNORMAL LOW (ref 135–145)
Total Bilirubin: 0.9 mg/dL (ref 0.3–1.2)
Total Protein: 7.6 g/dL (ref 6.5–8.1)

## 2023-05-30 LAB — CBG MONITORING, ED
Glucose-Capillary: 260 mg/dL — ABNORMAL HIGH (ref 70–99)
Glucose-Capillary: 237 mg/dL — ABNORMAL HIGH (ref 70–99)

## 2023-05-30 LAB — CBC
HCT: 40.5 % (ref 36.0–46.0)
Hemoglobin: 14 g/dL (ref 12.0–15.0)
MCH: 31.8 pg (ref 26.0–34.0)
MCHC: 34.6 g/dL (ref 30.0–36.0)
MCV: 92 fL (ref 80.0–100.0)
Platelets: 299 10*3/uL (ref 150–400)
RBC: 4.4 MIL/uL (ref 3.87–5.11)
RDW: 12.5 % (ref 11.5–15.5)
WBC: 9.3 10*3/uL (ref 4.0–10.5)
nRBC: 0 % (ref 0.0–0.2)

## 2023-05-30 LAB — HCG, SERUM, QUALITATIVE: Preg, Serum: NEGATIVE

## 2023-05-30 NOTE — ED Triage Notes (Signed)
Pt reports hyperglycemia x months. Pt reports nausea, vomiting, headache, and burning in her legs.

## 2023-05-30 NOTE — ED Notes (Signed)
Pt said she cannot use the bathroom right now

## 2023-05-30 NOTE — ED Provider Notes (Signed)
[place] EMERGENCY DEPARTMENT AT [facility] Provider Note   CSN: [id number] Arrival date & time: 05/30/23  1836     History  Chief Complaint  Patient presents with   Hyperglycemia    Morgan [name] is a 22 y.o. female.  Patient is a 22 year old female with a history of type 1 diabetes who is currently on insulin but was diagnosed less than a year ago and continues to struggle with elevated blood sugars.  She is coming in today because she had a few episodes of emesis today and some cramping in her abdomen and she spoke with her friend who is diabetic and they were concerned she may be going into DKA and told her she should come and get checked out.  She has not had any dysuria frequency or urgency.  She does describe numbness and tingling sensation like paresthesias in her legs but nothing is unilateral.  She does not have a headache, fever, chest pain, cough.  She has been compliant with her medications and continues to follow with her endocrinologist but reports her blood sugar still will be 300 or higher regularly at home.  She denies alcohol use.  She does use marijuana regularly but denies ever having issues with nausea and vomiting with it.  The history is provided by the patient.  Hyperglycemia      Home Medications Prior to Admission medications   Medication Sig Start Date End Date Taking? Authorizing Provider  amoxicillin-clavulanate (AUGMENTIN) 875-125 MG tablet Take 1 tablet by mouth every 12 (twelve) hours. 03/01/23   White, Elita Boone, NP  BD PEN NEEDLE NANO 2ND GEN 32G X 4 MM MISC Inject 1 Insert into the skin 4 (four) times daily. 02/15/23   [provider]  Continuous Glucose Sensor (DEXCOM G7 SENSOR) MISC Inject 1 Implant into the skin every 14 (fourteen) days. 02/26/23   [provider]  ondansetron (ZOFRAN-ODT) 4 MG disintegrating tablet Take 1 tablet (4 mg total) by mouth every 6 (six) hours as needed for nausea or vomiting. 12/18/21    Pyrtle, Carie Caddy, MD  predniSONE (DELTASONE) 20 MG tablet Take 2 tablets (40 mg total) by mouth daily. 03/01/23   White, Elita Boone, NP  valACYclovir (VALTREX) 500 MG tablet Take 500 mg by mouth as needed (cold sores).    [provider]      Allergies    Aspirin and Ethanol-alcohol [ethanol]    Review of Systems   Review of Systems  Physical Exam Updated Vital Signs BP 126/85 (BP Location: Right Arm)   Pulse 77   Temp 98.3 F (36.8 C) (Oral)   Resp 16   SpO2 99%  Physical Exam Vitals and nursing note reviewed.  Constitutional:      General: She is not in acute distress.    Appearance: She is well-developed and underweight.  HENT:     Head: Normocephalic and atraumatic.     Mouth/Throat:     Mouth: Mucous membranes are moist.  Eyes:     Pupils: Pupils are equal, round, and reactive to light.  Cardiovascular:     Rate and Rhythm: Normal rate and regular rhythm.     Heart sounds: Normal heart sounds. No murmur heard.    No friction rub.  Pulmonary:     Effort: Pulmonary effort is normal.     Breath sounds: Normal breath sounds. No wheezing or rales.  Abdominal:     General: Bowel sounds are normal. There is no distension.  Palpations: Abdomen is soft.     Tenderness: There is no abdominal tenderness. There is no guarding or rebound.  Musculoskeletal:        General: No tenderness. Normal range of motion.     Right lower leg: No edema.     Left lower leg: No edema.     Comments: No edema  Skin:    General: Skin is warm and dry.     Findings: No rash.  Neurological:     Mental Status: She is alert and oriented to person, place, and time. Mental status is at baseline.     Cranial Nerves: No cranial nerve deficit.  Psychiatric:        Behavior: Behavior normal.     ED Results / Procedures / Treatments   Labs (all labs ordered are listed, but only abnormal results are displayed) Labs Reviewed  URINALYSIS, ROUTINE W REFLEX MICROSCOPIC - Abnormal; Notable  for the following components:      Result Value   Specific Gravity, Urine 1.032 (*)    Glucose, UA >=500 (*)    Ketones, ur 20 (*)    All other components within normal limits  COMPREHENSIVE METABOLIC PANEL - Abnormal; Notable for the following components:   Sodium 133 (*)    Glucose, Bld 262 (*)    BUN 21 (*)    All other components within normal limits  CBG MONITORING, ED - Abnormal; Notable for the following components:   Glucose-Capillary 260 (*)    All other components within normal limits  CBG MONITORING, ED - Abnormal; Notable for the following components:   Glucose-Capillary 237 (*)    All other components within normal limits  CBC  HCG, SERUM, QUALITATIVE  CBG MONITORING, ED    EKG None  Radiology No results found.  Procedures Procedures    Medications Ordered in ED Medications - No data to display  ED Course/ Medical Decision Making/ A&P                                 Medical Decision Making Amount and/or Complexity of Data Reviewed Labs: ordered. Decision-making details documented in ED Course.   Pt with multiple medical problems and comorbidities and presenting today with a complaint that caries a high risk for morbidity and mortality.  Here today with a few episodes of emesis and some intermittent abdominal cramping.  Patient has been compliant with her medication.  She denies any specific infectious symptoms.  Menses have been normal.  I independently interpreted patient's labs and CBC within normal limits, hCG is negative, CMP with blood sugar of 262 which is improved from prior and normal anion gap of 10.  Low suspicion for DKA at this time but will check a urine to ensure no ketones.  Patient denies symptoms suggestive of a UTI.  Gave patient the option of IV and fluids but she feels like she can drink fluids at home.  UA with 20 ketones but no other acute findings.  At this time patient is well-appearing and stable for discharge  home.         Final Clinical Impression(s) / ED Diagnoses Final diagnoses:  Hyperglycemia    Rx / DC Orders ED Discharge Orders     None         Gwyneth Sprout, MD 05/30/23 2358

## 2023-05-30 NOTE — Discharge Instructions (Signed)
There are a few ketones in your urine but otherwise all your labs look normal.  Just make sure to hydrate at home and continue taking your insulin.  If you start having persistent vomiting, severe abdominal cramping return to the emergency room

## 2023-07-17 ENCOUNTER — Emergency Department (HOSPITAL_BASED_OUTPATIENT_CLINIC_OR_DEPARTMENT_OTHER): Payer: Medicaid Other | Admitting: Radiology

## 2023-07-17 ENCOUNTER — Encounter (HOSPITAL_BASED_OUTPATIENT_CLINIC_OR_DEPARTMENT_OTHER): Payer: Self-pay | Admitting: *Deleted

## 2023-07-17 ENCOUNTER — Emergency Department (HOSPITAL_BASED_OUTPATIENT_CLINIC_OR_DEPARTMENT_OTHER)
Admission: EM | Admit: 2023-07-17 | Discharge: 2023-07-17 | Disposition: A | Discharge status: Home / Self Care | Payer: Medicaid Other | Attending: Emergency Medicine | Admitting: Emergency Medicine

## 2023-07-17 ENCOUNTER — Other Ambulatory Visit: Payer: Self-pay

## 2023-07-17 DIAGNOSIS — S60416A Abrasion of right little finger, initial encounter: Secondary | ICD-10-CM | POA: Insufficient documentation

## 2023-07-17 DIAGNOSIS — T148XXA Other injury of unspecified body region, initial encounter: Secondary | ICD-10-CM

## 2023-07-17 DIAGNOSIS — W540XXA Bitten by dog, initial encounter: Secondary | ICD-10-CM | POA: Insufficient documentation

## 2023-07-17 DIAGNOSIS — S6991XA Unspecified injury of right wrist, hand and finger(s), initial encounter: Secondary | ICD-10-CM | POA: Diagnosis present

## 2023-07-17 DIAGNOSIS — S60414A Abrasion of right ring finger, initial encounter: Secondary | ICD-10-CM | POA: Diagnosis not present

## 2023-07-17 MED ORDER — AMOXICILLIN-POT CLAVULANATE 875-125 MG PO TABS
1.0000 | ORAL_TABLET | Freq: Once | ORAL | Status: AC
  Administered 2023-07-17: 1 via ORAL
  Filled 2023-07-17: qty 1

## 2023-07-17 MED ORDER — HYDROCODONE-ACETAMINOPHEN 5-325 MG PO TABS
1.0000 | ORAL_TABLET | Freq: Once | ORAL | Status: AC
  Administered 2023-07-17: 1 via ORAL
  Filled 2023-07-17: qty 1

## 2023-07-17 MED ORDER — AMOXICILLIN-POT CLAVULANATE 875-125 MG PO TABS: 1.0000 | ORAL_TABLET | Freq: Two times a day (BID) | ORAL | 0 refills | Status: DC

## 2023-07-17 NOTE — ED Notes (Signed)
Placed bacitracin on puncture wounds and wrapped with kerlix.

## 2023-07-17 NOTE — ED Notes (Signed)
Pt given AA batteries for her insulin pump

## 2023-07-17 NOTE — ED Triage Notes (Signed)
Pt was introducing her dog to another dog when there was a Engineer, agricultural and she got bitten on her right hand.  Pt has bite marks to right pinky and ring finger.  Bleeding controlled.  Dog is up to date on rabies vaccine.  Pt had last tetanus shot in the last 5 years.

## 2023-07-17 NOTE — ED Notes (Signed)
Pt is type 1 diabetic and has an insulin pump.  Current blood glucose (from her insulin pump) is 233 as she ate pta.

## 2023-07-17 NOTE — ED Provider Notes (Signed)
[place] EMERGENCY DEPARTMENT AT [facility] Provider Note   CSN: [id number] Arrival date & time: 07/17/23  0140     History  Chief Complaint  Patient presents with   Animal Bite    Morgan [name] is a 22 y.o. female.  The history is provided by the patient.  Patient with history of diabetes presents with dog bite.  Patient reports that she was introducing her dog to another 1 when they started fighting and she was bit on the right hand.  She reports her dog is the one that bit her and he is fully vaccinated for rabies Patient reports multiple wounds to her right hand.  She reports it hurts to move it.  She reports she has an insulin pump in place but no recent issues with her diabetes     Home Medications Prior to Admission medications   Medication Sig Start Date End Date Taking? Authorizing Provider  amoxicillin-clavulanate (AUGMENTIN) 875-125 MG tablet Take 1 tablet by mouth every 12 (twelve) hours. 07/17/23  Yes Zadie Rhine, MD  BD PEN NEEDLE NANO 2ND GEN 32G X 4 MM MISC Inject 1 Insert into the skin 4 (four) times daily. 02/15/23   [provider]  Continuous Glucose Sensor (DEXCOM G7 SENSOR) MISC Inject 1 Implant into the skin every 14 (fourteen) days. 02/26/23   [provider]  valACYclovir (VALTREX) 500 MG tablet Take 500 mg by mouth as needed (cold sores).    [provider]      Allergies    Aspirin and Ethanol-alcohol [ethanol]    Review of Systems   Review of Systems  Physical Exam Updated Vital Signs BP (!) 132/93   Pulse 88   Temp 98 F (36.7 C) (Oral)   Resp 16   Wt 39.9 kg   SpO2 99%   BMI 17.77 kg/m  Physical Exam CONSTITUTIONAL: Well developed/well nourished HEAD: Normocephalic/atraumatic NEURO: Pt is awake/alert/appropriate, moves all extremitiesx4.  No facial droop.   EXTREMITIES: pulses normal/equal, full ROM Scattered superficial abrasions to the right hand.  No deformities.  Full flexion  extension of all fingers on the right hand.  She is able to make a fist with the right hand. SKIN: warm, color normal, see photo PSYCH: no abnormalities of mood noted, alert and oriented to situation       ED Results / Procedures / Treatments   Labs (all labs ordered are listed, but only abnormal results are displayed) Labs Reviewed - No data to display  EKG None  Radiology DG Hand Complete Right  Result Date: 07/17/2023 CLINICAL DATA:  pain, dog bite Pt has bite marks to right pinky and ring finge EXAM: RIGHT HAND - COMPLETE 3+ VIEW COMPARISON:  None Available. FINDINGS: There is no evidence of fracture or dislocation. There is no evidence of arthropathy or other focal bone abnormality. Soft tissues are unremarkable. No retained radiopaque foreign body. IMPRESSION: Negative. Electronically Signed   By: Tish Frederickson M.D.   On: 07/17/2023 03:30    Procedures Procedures    Medications Ordered in ED Medications  amoxicillin-clavulanate (AUGMENTIN) 875-125 MG per tablet 1 tablet (1 tablet Oral Given 07/17/23 0235)  HYDROcodone-acetaminophen (NORCO/VICODIN) 5-325 MG per tablet 1 tablet (1 tablet Oral Given 07/17/23 0234)    ED Course/ Medical Decision Making/ A&P Clinical Course as of 07/17/23 0346  Thu Jul 17, 2023  0247 Patient was accidentally bit by her own dog on her right hand.  No obvious signs of any tendon injury.  Will  need to start antibiotics.  Wounds will be left open.  Wounds were cleaned here in the ER.  She reports her tetanus is up-to-date [DW]  0346 Xray is negative for fracture Pt otherwise stable Advised will need to keep good control of glucose She is safe for d/c home [DW]    Clinical Course User Index [DW] Zadie Rhine, MD                                 Medical Decision Making Amount and/or Complexity of Data Reviewed Radiology: ordered.  Risk Prescription drug management.           Final Clinical Impression(s) / ED  Diagnoses Final diagnoses:  Animal bite  Dog bite, initial encounter    Rx / DC Orders ED Discharge Orders          Ordered    amoxicillin-clavulanate (AUGMENTIN) 875-125 MG tablet  Every 12 hours        07/17/23 0221              Zadie Rhine, MD 07/17/23 [phone]

## 2023-07-17 NOTE — ED Notes (Signed)
Pt soaking hand in betadine NS mixture

## 2023-08-24 ENCOUNTER — Other Ambulatory Visit: Payer: Self-pay

## 2023-08-24 ENCOUNTER — Emergency Department (HOSPITAL_COMMUNITY)
Admission: EM | Admit: 2023-08-24 | Discharge: 2023-08-24 | Disposition: A | Discharge status: Home / Self Care | Payer: Medicaid Other | Attending: Emergency Medicine | Admitting: Emergency Medicine

## 2023-08-24 ENCOUNTER — Encounter (HOSPITAL_COMMUNITY): Payer: Self-pay

## 2023-08-24 DIAGNOSIS — Z794 Long term (current) use of insulin: Secondary | ICD-10-CM | POA: Insufficient documentation

## 2023-08-24 DIAGNOSIS — E109 Type 1 diabetes mellitus without complications: Secondary | ICD-10-CM | POA: Diagnosis not present

## 2023-08-24 DIAGNOSIS — E162 Hypoglycemia, unspecified: Secondary | ICD-10-CM | POA: Diagnosis present

## 2023-08-24 LAB — CBC WITH DIFFERENTIAL/PLATELET
Abs Immature Granulocytes: 0.02 10*3/uL (ref 0.00–0.07)
Basophils Absolute: 0.1 10*3/uL (ref 0.0–0.1)
Basophils Relative: 1 %
Eosinophils Absolute: 0.1 10*3/uL (ref 0.0–0.5)
Eosinophils Relative: 1 %
HCT: 41.2 % (ref 36.0–46.0)
Hemoglobin: 14.5 g/dL (ref 12.0–15.0)
Immature Granulocytes: 0 %
Lymphocytes Relative: 27 %
Lymphs Abs: 2.4 10*3/uL (ref 0.7–4.0)
MCH: 32.8 pg (ref 26.0–34.0)
MCHC: 35.2 g/dL (ref 30.0–36.0)
MCV: 93.2 fL (ref 80.0–100.0)
Monocytes Absolute: 0.9 10*3/uL (ref 0.1–1.0)
Monocytes Relative: 10 %
Neutro Abs: 5.5 10*3/uL (ref 1.7–7.7)
Neutrophils Relative %: 61 %
Platelets: 296 10*3/uL (ref 150–400)
RBC: 4.42 MIL/uL (ref 3.87–5.11)
RDW: 11.9 % (ref 11.5–15.5)
WBC: 9 10*3/uL (ref 4.0–10.5)
nRBC: 0 % (ref 0.0–0.2)

## 2023-08-24 LAB — CBG MONITORING, ED
Glucose-Capillary: 133 mg/dL — ABNORMAL HIGH (ref 70–99)
Glucose-Capillary: 144 mg/dL — ABNORMAL HIGH (ref 70–99)
Glucose-Capillary: 176 mg/dL — ABNORMAL HIGH (ref 70–99)
Glucose-Capillary: 204 mg/dL — ABNORMAL HIGH (ref 70–99)
Glucose-Capillary: 183 mg/dL — ABNORMAL HIGH (ref 70–99)

## 2023-08-24 LAB — URINALYSIS, ROUTINE W REFLEX MICROSCOPIC
Bacteria, UA: NONE SEEN
Bilirubin Urine: NEGATIVE
Glucose, UA: >=500 mg/dL — AB
Hgb urine dipstick: NEGATIVE
Ketones, ur: NEGATIVE mg/dL
Leukocytes,Ua: NEGATIVE
Nitrite: NEGATIVE
Protein, ur: NEGATIVE mg/dL
Specific Gravity, Urine: 1.012 (ref 1.005–1.030)
pH: 6 (ref 5.0–8.0)

## 2023-08-24 LAB — BASIC METABOLIC PANEL
Anion gap: 12 (ref 5–15)
BUN: 12 mg/dL (ref 6–20)
CO2: 20 mmol/L — ABNORMAL LOW (ref 22–32)
Calcium: 9.5 mg/dL (ref 8.9–10.3)
Chloride: 107 mmol/L (ref 98–111)
Creatinine, Ser: 0.54 mg/dL (ref 0.44–1.00)
GFR, Estimated: >60 mL/min (ref >60)
Glucose, Bld: 155 mg/dL — ABNORMAL HIGH (ref 70–99)
Potassium: 3.8 mmol/L (ref 3.5–5.1)
Sodium: 139 mmol/L (ref 135–145)

## 2023-08-24 LAB — PREGNANCY, URINE: Preg Test, Ur: NEGATIVE

## 2023-08-24 LAB — BRAIN NATRIURETIC PEPTIDE: B Natriuretic Peptide: 26.1 pg/mL (ref 0.0–100.0)

## 2023-08-24 MED ORDER — INSULIN GLARGINE-YFGN 100 UNIT/ML ~~LOC~~ SOLN
6.0000 [IU] | Freq: Once | SUBCUTANEOUS | Status: AC
  Administered 2023-08-24: 6 [IU] via SUBCUTANEOUS
  Filled 2023-08-24: qty 0.06

## 2023-08-24 MED ORDER — BLOOD GLUCOSE TEST VI STRP
1.0000 | ORAL_STRIP | Freq: Three times a day (TID) | 0 refills | Status: AC
Start: 2023-08-24 — End: 2023-09-23

## 2023-08-24 MED ORDER — BLOOD GLUCOSE MONITORING SUPPL DEVI
1.0000 | Freq: Three times a day (TID) | 0 refills | Status: AC
Start: 2023-08-24 — End: ?

## 2023-08-24 MED ORDER — LANCET DEVICE MISC
1.0000 | Freq: Three times a day (TID) | 0 refills | Status: AC
Start: 2023-08-24 — End: 2023-09-23

## 2023-08-24 MED ORDER — LANCETS MISC. MISC
1.0000 | Freq: Three times a day (TID) | 0 refills | Status: AC
Start: 2023-08-24 — End: 2023-09-23

## 2023-08-24 MED ORDER — INSULIN DEGLUDEC 100 UNIT/ML ~~LOC~~ SOPN: 6.0000 [IU] | PEN_INJECTOR | Freq: Once | SUBCUTANEOUS | Status: DC

## 2023-08-24 NOTE — ED Provider Notes (Signed)
[place] EMERGENCY DEPARTMENT AT [facility] Provider Note   CSN: [id number] Arrival date & time: 08/24/23  1308     History  Chief Complaint  Patient presents with   Hypoglycemia    Morgan [name] is a 22 y.o. female.  Patient with history of T1DM, on insulin pump presents after calling EMS for feeling that her blood sugar was dropping. She states she is unable to check her blood sugar level as her arm sensor is not functioning and she has no way to check a finger CBG. No vomiting. She describes her symptoms as just not feeling well. She denies recent illness.   The history is provided by the patient. No language interpreter was used.  Hypoglycemia      Home Medications Prior to Admission medications   Medication Sig Start Date End Date Taking? Authorizing Provider  Blood Glucose Monitoring Suppl DEVI 1 each by Does not apply route in the morning, at noon, and at bedtime. May substitute to any manufacturer covered by patient's insurance. 08/24/23  Yes Emeril Stille, PA-C  Glucose Blood (BLOOD GLUCOSE TEST STRIPS) STRP 1 each by In Vitro route in the morning, at noon, and at bedtime. May substitute to any manufacturer covered by patient's insurance. 08/24/23 09/23/23 Yes Uchenna Seufert, PA-C  HUMALOG 100 UNIT/ML injection Inject into the skin. 06/17/23  Yes [provider]  Lancet Device MISC 1 each by Does not apply route in the morning, at noon, and at bedtime. May substitute to any manufacturer covered by patient's insurance. 08/24/23 09/23/23 Yes Elpidio Anis, PA-C  Lancets Misc. MISC 1 each by Does not apply route in the morning, at noon, and at bedtime. May substitute to any manufacturer covered by patient's insurance. 08/24/23 09/23/23 Yes Carlos Quackenbush, Melvenia Beam, PA-C  amoxicillin-clavulanate (AUGMENTIN) 875-125 MG tablet Take 1 tablet by mouth every 12 (twelve) hours. 07/17/23   Zadie Rhine, MD  BD PEN NEEDLE NANO 2ND GEN 32G X 4 MM MISC Inject 1 Insert  into the skin 4 (four) times daily. 02/15/23   [provider]  Continuous Glucose Sensor (DEXCOM G7 SENSOR) MISC Inject 1 Implant into the skin every 14 (fourteen) days. 02/26/23   [provider]  TRESIBA FLEXTOUCH 100 UNIT/ML FlexTouch Pen Inject into the skin every morning.    [provider]  valACYclovir (VALTREX) 500 MG tablet Take 500 mg by mouth as needed (cold sores).    [provider]      Allergies    Aspirin and Ethanol-alcohol [ethanol]    Review of Systems   Review of Systems  Physical Exam Updated Vital Signs BP (!) 127/95 (BP Location: Left Arm)   Pulse 98   Temp 98.4 F (36.9 C) (Oral)   Resp 16   LMP  (LMP Unknown)   SpO2 100%  Physical Exam Vitals and nursing note reviewed.  Constitutional:      Appearance: Normal appearance.  HENT:     Mouth/Throat:     Mouth: Mucous membranes are dry.  Cardiovascular:     Rate and Rhythm: Normal rate and regular rhythm.     Heart sounds: No murmur heard. Pulmonary:     Effort: Pulmonary effort is normal.     Breath sounds: No wheezing, rhonchi or rales.  Abdominal:     General: There is no distension.     Palpations: Abdomen is soft.     Tenderness: There is no abdominal tenderness.  Musculoskeletal:     Cervical back: Normal range of motion  and neck supple.  Skin:    General: Skin is warm and dry.  Neurological:     Mental Status: She is alert and oriented to person, place, and time.     ED Results / Procedures / Treatments   Labs (all labs ordered are listed, but only abnormal results are displayed) Labs Reviewed  URINALYSIS, ROUTINE W REFLEX MICROSCOPIC - Abnormal; Notable for the following components:      Result Value   Color, Urine STRAW (*)    Glucose, UA >=500 (*)    All other components within normal limits  BASIC METABOLIC PANEL - Abnormal; Notable for the following components:   CO2 20 (*)    Glucose, Bld 155 (*)    All other components within normal limits   CBG MONITORING, ED - Abnormal; Notable for the following components:   Glucose-Capillary 133 (*)    All other components within normal limits  CBG MONITORING, ED - Abnormal; Notable for the following components:   Glucose-Capillary 144 (*)    All other components within normal limits  CBG MONITORING, ED - Abnormal; Notable for the following components:   Glucose-Capillary 176 (*)    All other components within normal limits  CBG MONITORING, ED - Abnormal; Notable for the following components:   Glucose-Capillary 204 (*)    All other components within normal limits  CBC WITH DIFFERENTIAL/PLATELET  BRAIN NATRIURETIC PEPTIDE  PREGNANCY, URINE    EKG None  Radiology No results found.  Procedures Procedures    Medications Ordered in ED Medications  insulin glargine-yfgn (SEMGLEE) injection 6 Units (6 Units Subcutaneous Given 08/24/23 1318)    ED Course/ Medical Decision Making/ A&P                                 Medical Decision Making This patient presents to the ED for concern of hypoglycemia, this involves an extensive number of treatment options, and is a complaint that carries with it a high risk of complications and morbidity.  The differential diagnosis includes insulin overdose/misdose   Co morbidities that complicate the patient evaluation  T1DM, still adjusting to treatment   Additional history obtained:  Additional history and/or information obtained from chart review, notable for previous ED charts for both hyper- and hypoglycemia   Lab Tests:  I Ordered, and personally interpreted labs.  The pertinent results include:  CBG consistently above 100 (133, 144, 176); no acidosis; no evidence infection   Consultation with: Diabetes Coordinator: Advises to give 6U Tresiba in the ED prior to discharge. At that time, patient will be advised to turn off her pump and leave it off until contact with Dr. Willeen Cass office tomorrow. A glucometer Rx will be sent to her  pharmacy with lancets and strips and the patient is advised to check her blood sugar every 4 hours. She will also be advised to return to the ED with any reading of >400 or <100.   Test Considered:  Repeat CBGs. Chemistries/CBC/UA r/o infection   Problem List / ED Course:  Patient with insulin pump, newly diagnosed T1DM, having trouble transitioning into routine care of DM. Has no way to check CBG at home stating she is missing part of her sensor.    Reevaluation:  After the interventions noted above, I reevaluated the patient and found that they have :improved No episodes hypoglycemia in ED after 5 hours of observation   Social Determinants of Health:  Poor insight into diabetes care   Disposition:  After consideration of the diagnostic results and the patients response to treatment, I feel that the patient would benefit from discharge to home.    Amount and/or Complexity of Data Reviewed Labs: ordered.  Risk Prescription drug management.           Final Clinical Impression(s) / ED Diagnoses Final diagnoses:  Type 1 diabetes mellitus without complication (HCC)    Rx / DC Orders ED Discharge Orders          Ordered    Blood Glucose Monitoring Suppl DEVI  3 times daily        08/24/23 1324    Glucose Blood (BLOOD GLUCOSE TEST STRIPS) STRP  3 times daily        08/24/23 1324    Lancet Device MISC  3 times daily        08/24/23 1324    Lancets Misc. MISC  3 times daily        08/24/23 1324              Elpidio Anis, PA-C 08/24/23 1326    Sloan Leiter, DO 08/28/23 [phone]

## 2023-08-24 NOTE — ED Triage Notes (Signed)
Pt to ED from home with c/o hypoglycemia. Pt states her CBG was 285 last night, but that it has dropped rapidly and is now at 80. Pt states she started drinking juice in efforts to keep sugar up but that she has been unable to keep it down. Pt endorses overall "not feeling well". Pt has concerns that she may be in DKA. Arrives to ED with a CBG of 133, pt does have an insulin pump.VSS, NADN.

## 2023-08-24 NOTE — ED Notes (Signed)
BMP being added to previous labs collected

## 2023-08-24 NOTE — ED Notes (Signed)
Pt ambulated to bathroom 

## 2023-08-24 NOTE — Discharge Instructions (Signed)
A prescription for the glucometer and blood sugar testing supplies was sent to the pharmacy we discussed. Check your blood sugar every 4 hours.  Return to the ED with any reading over 400 or under 100.   You have been given a long-acting insulin in the ED by recommendation of the diabetes coordinator who assisted with dosing. Leave your insulin pump off until seen by Dr. Talmage Nap and obtaining further education and instruction regarding treatment of your diabetes.

## 2023-11-04 IMAGING — MG MM BREAST LOCALIZATION CLIP
6 of 10 series · 6 of 30 positions shown · non-contrast
Comparison: Previous exam(s).

CLINICAL DATA: 20-year-old female with an enlarging biopsy proven
fibroadenoma in the right breast at [DATE] 3 cm from the nipple
(ribbon shaped clip). Recent diagnostic ultrasound showed the
fibroadenoma at [DATE] 3 cm from the nipple had enlarged. She also had
enlarging masses at 8 o'clock 3 cm from the nipple and at [DATE] 5 cm
from the nipple. Ultrasound-guided bracketed radioactive seed
localization requested. Radioactive seeds were placed into the
masses at 8 o'clock 3 cm from the nipple and at [DATE] 5 cm from the
nipple.

EXAM:
3D DIAGNOSTIC RIGHT MAMMOGRAM POST ULTRASOUND BIOPSY

[R CC synth-2D (1 of 4)]
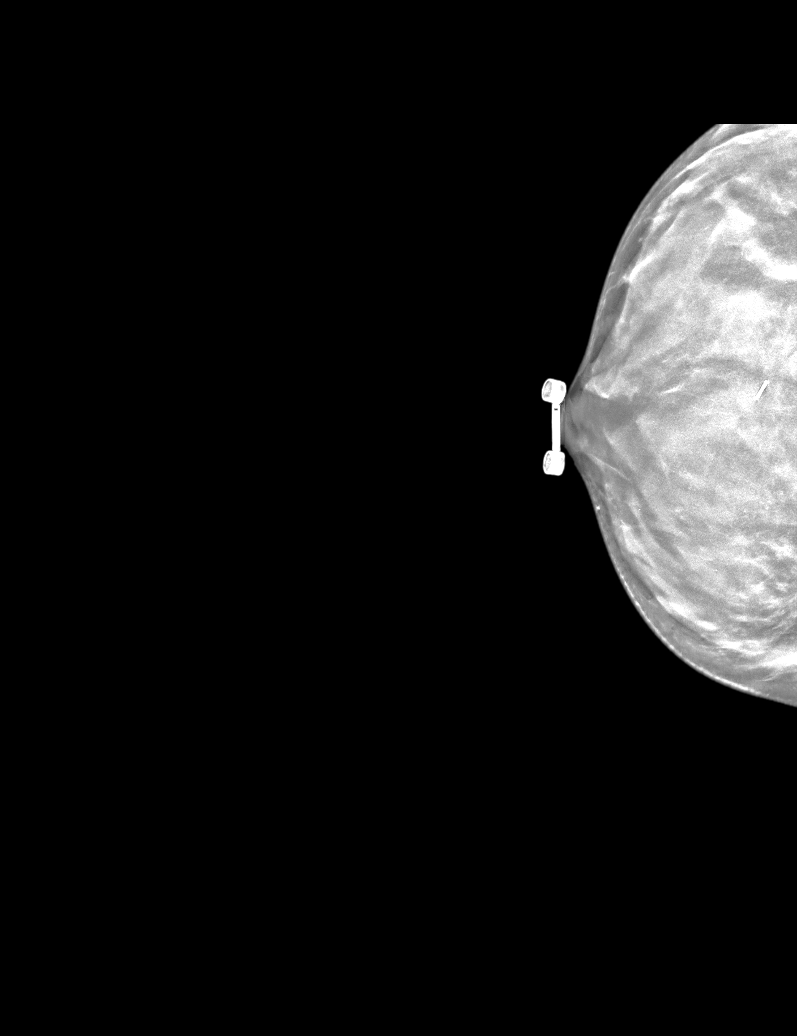

[R ML synth-2D]
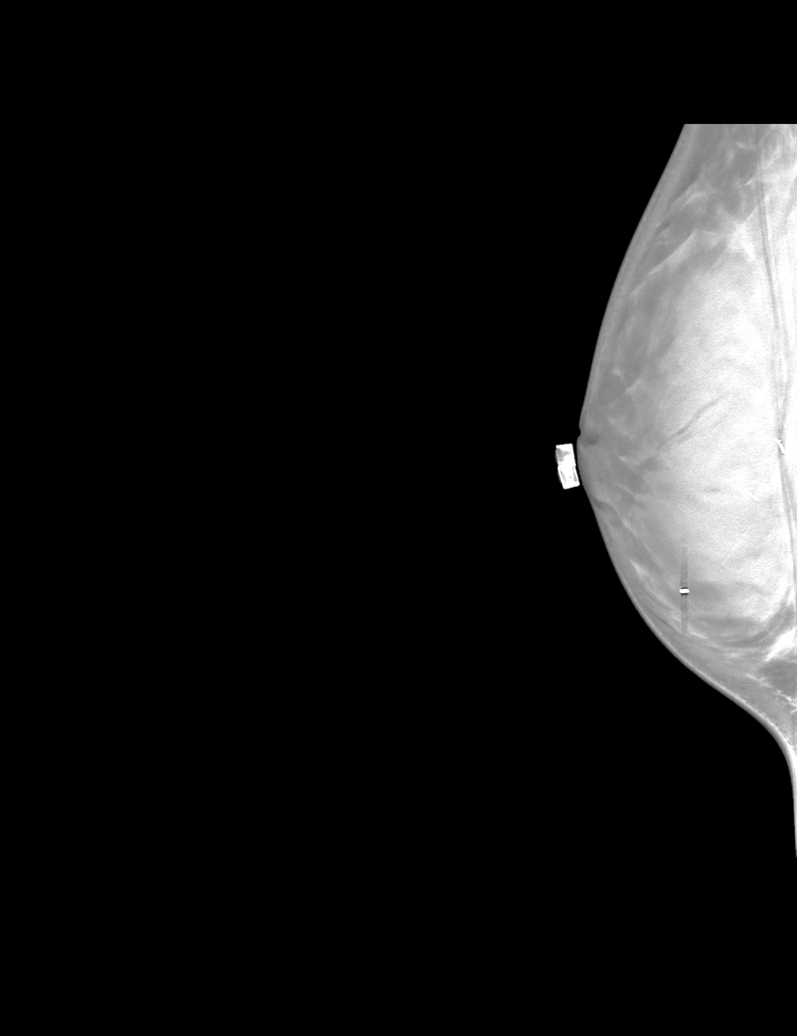

[R CC synth-2D (2 of 4)]
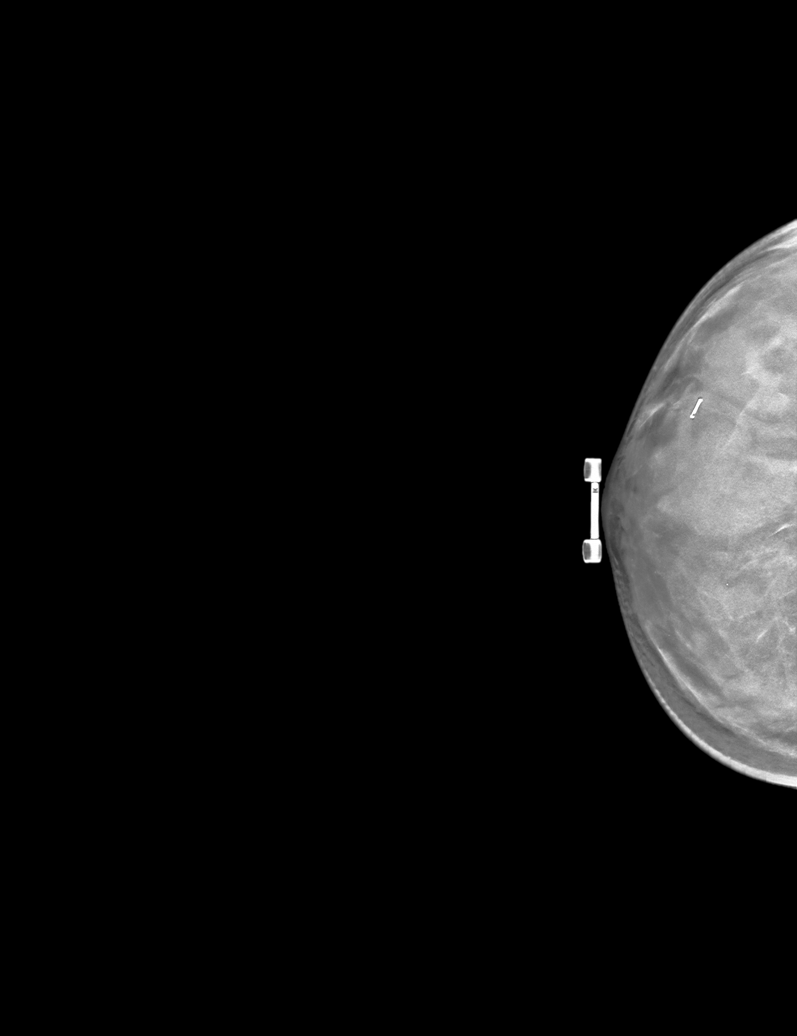

[R CC synth-2D (3 of 4)]
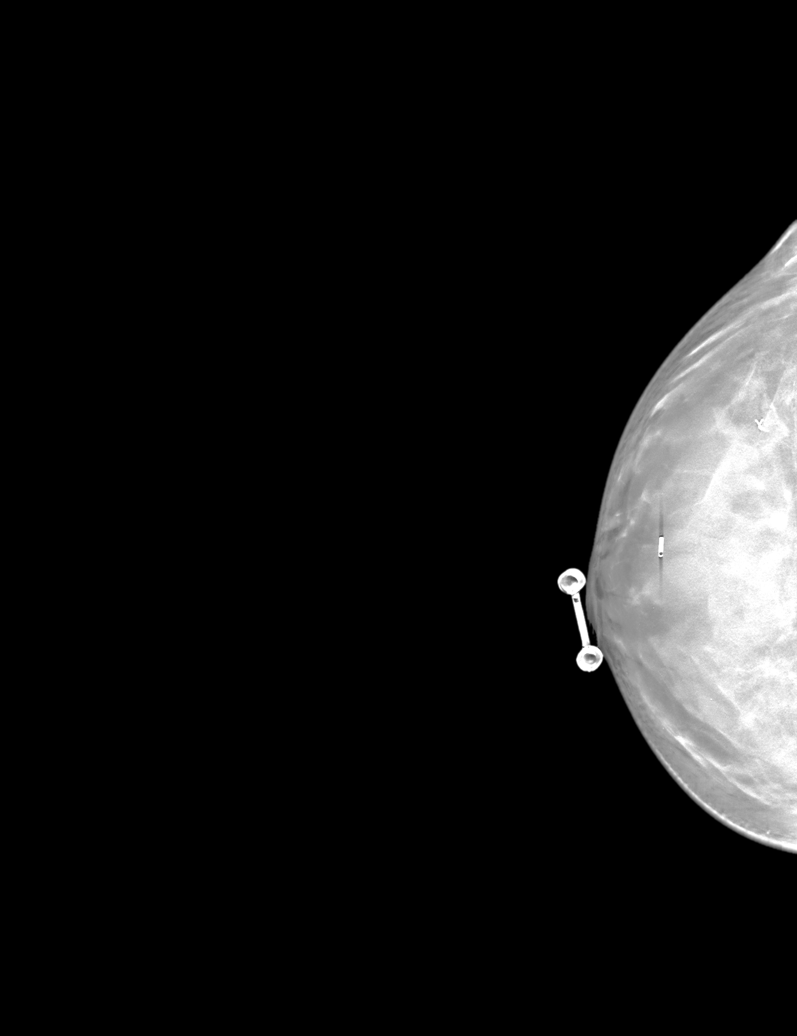

[R CC synth-2D (4 of 4)]
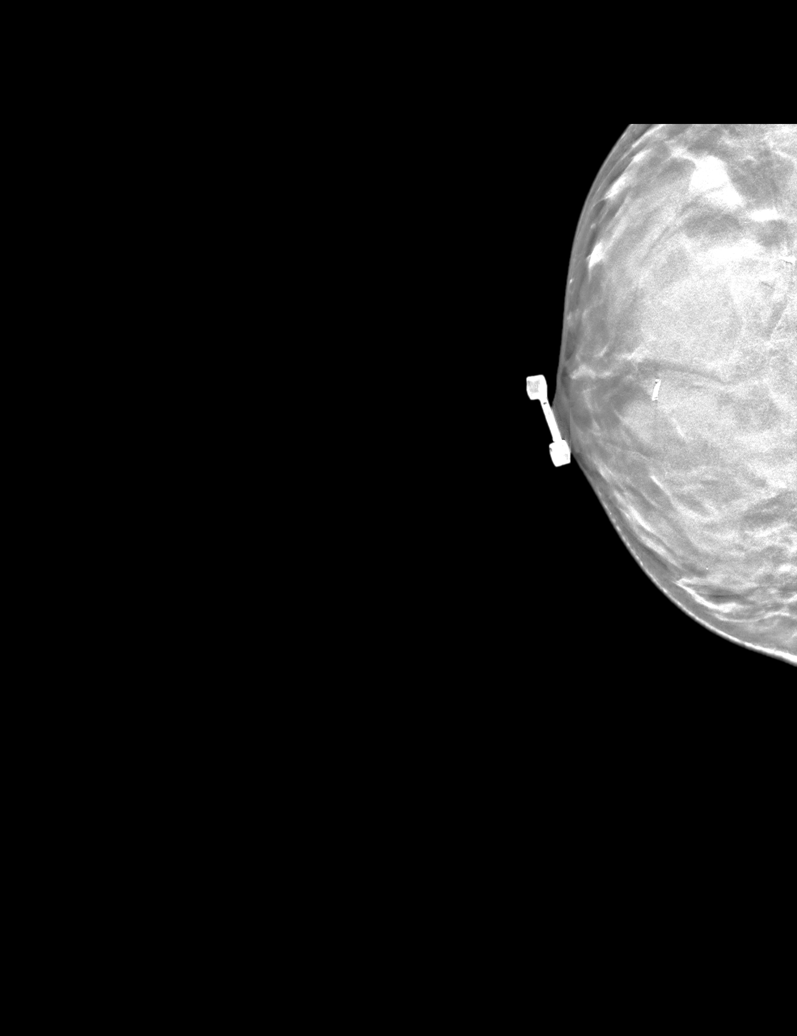

[R CC tomo · tomo slice 27/52.0]
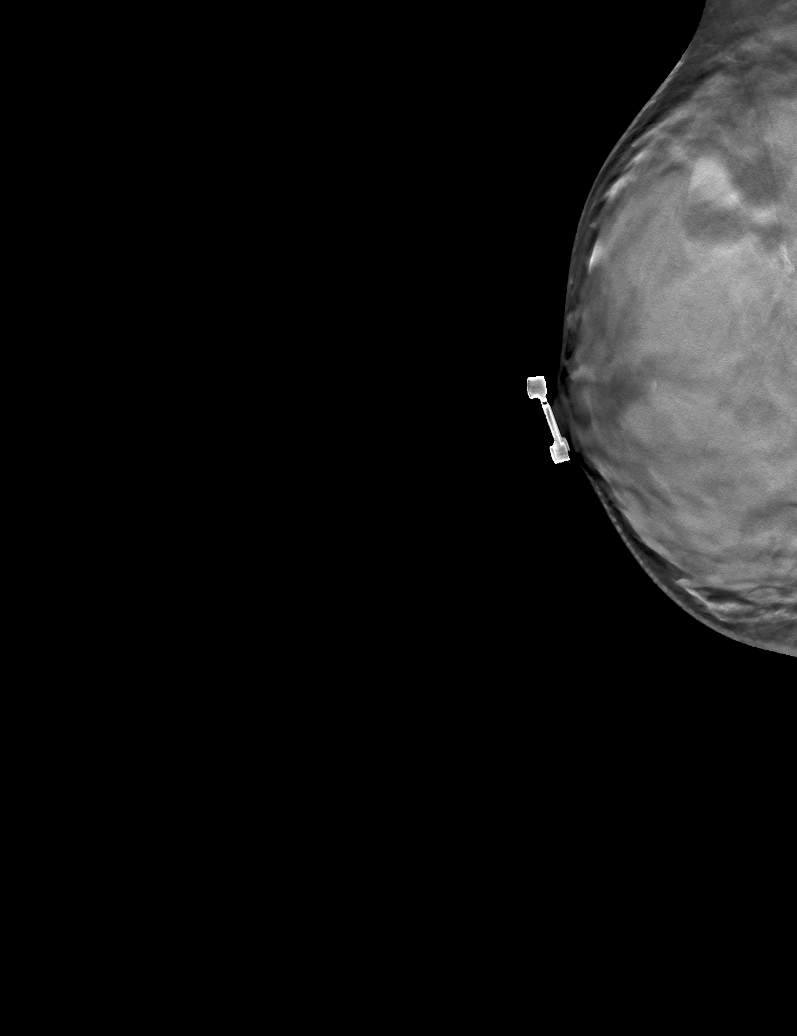

[6 of 30 positions shown; findings below may reference images not displayed]

FINDINGS: 3D Mammographic images were obtained following ultrasound-guided
radioactive seeds into the masses at 8 o'clock 3 cm from the nipple
and [DATE] 5 cm from the nipple. On the lateral view the 2 radioactive
seeds are seen bracketing the mass with the ribbon shaped clip at
[DATE] 3 cm from the nipple. On the cc view the more posterior
radioactive seed is not imaged secondary to its far posterior
location.
IMPRESSION: Appropriate positioning of the radioactive seeds bracketing the
larger fibroadenoma in the right breast at [DATE] 3 cm from the
nipple. The radioactive seeds were placed into the masses at 8
o'clock 3 cm from the nipple and at [DATE] 5 cm from the nipple.

Final Assessment: Post Procedure Mammograms for Marker Placement

## 2023-11-05 IMAGING — MG MM BREAST SURGICAL SPECIMEN
1 series · 2 of 2 positions shown · non-contrast
Comparison: Previous exam(s).

CLINICAL DATA: Post lumpectomy specimen radiograph

EXAM:
SPECIMEN RADIOGRAPH OF THE RIGHT BREAST

[Series 1: R · right · 0.07mm/px · 2 of 2 slices shown]
[im 1/2]
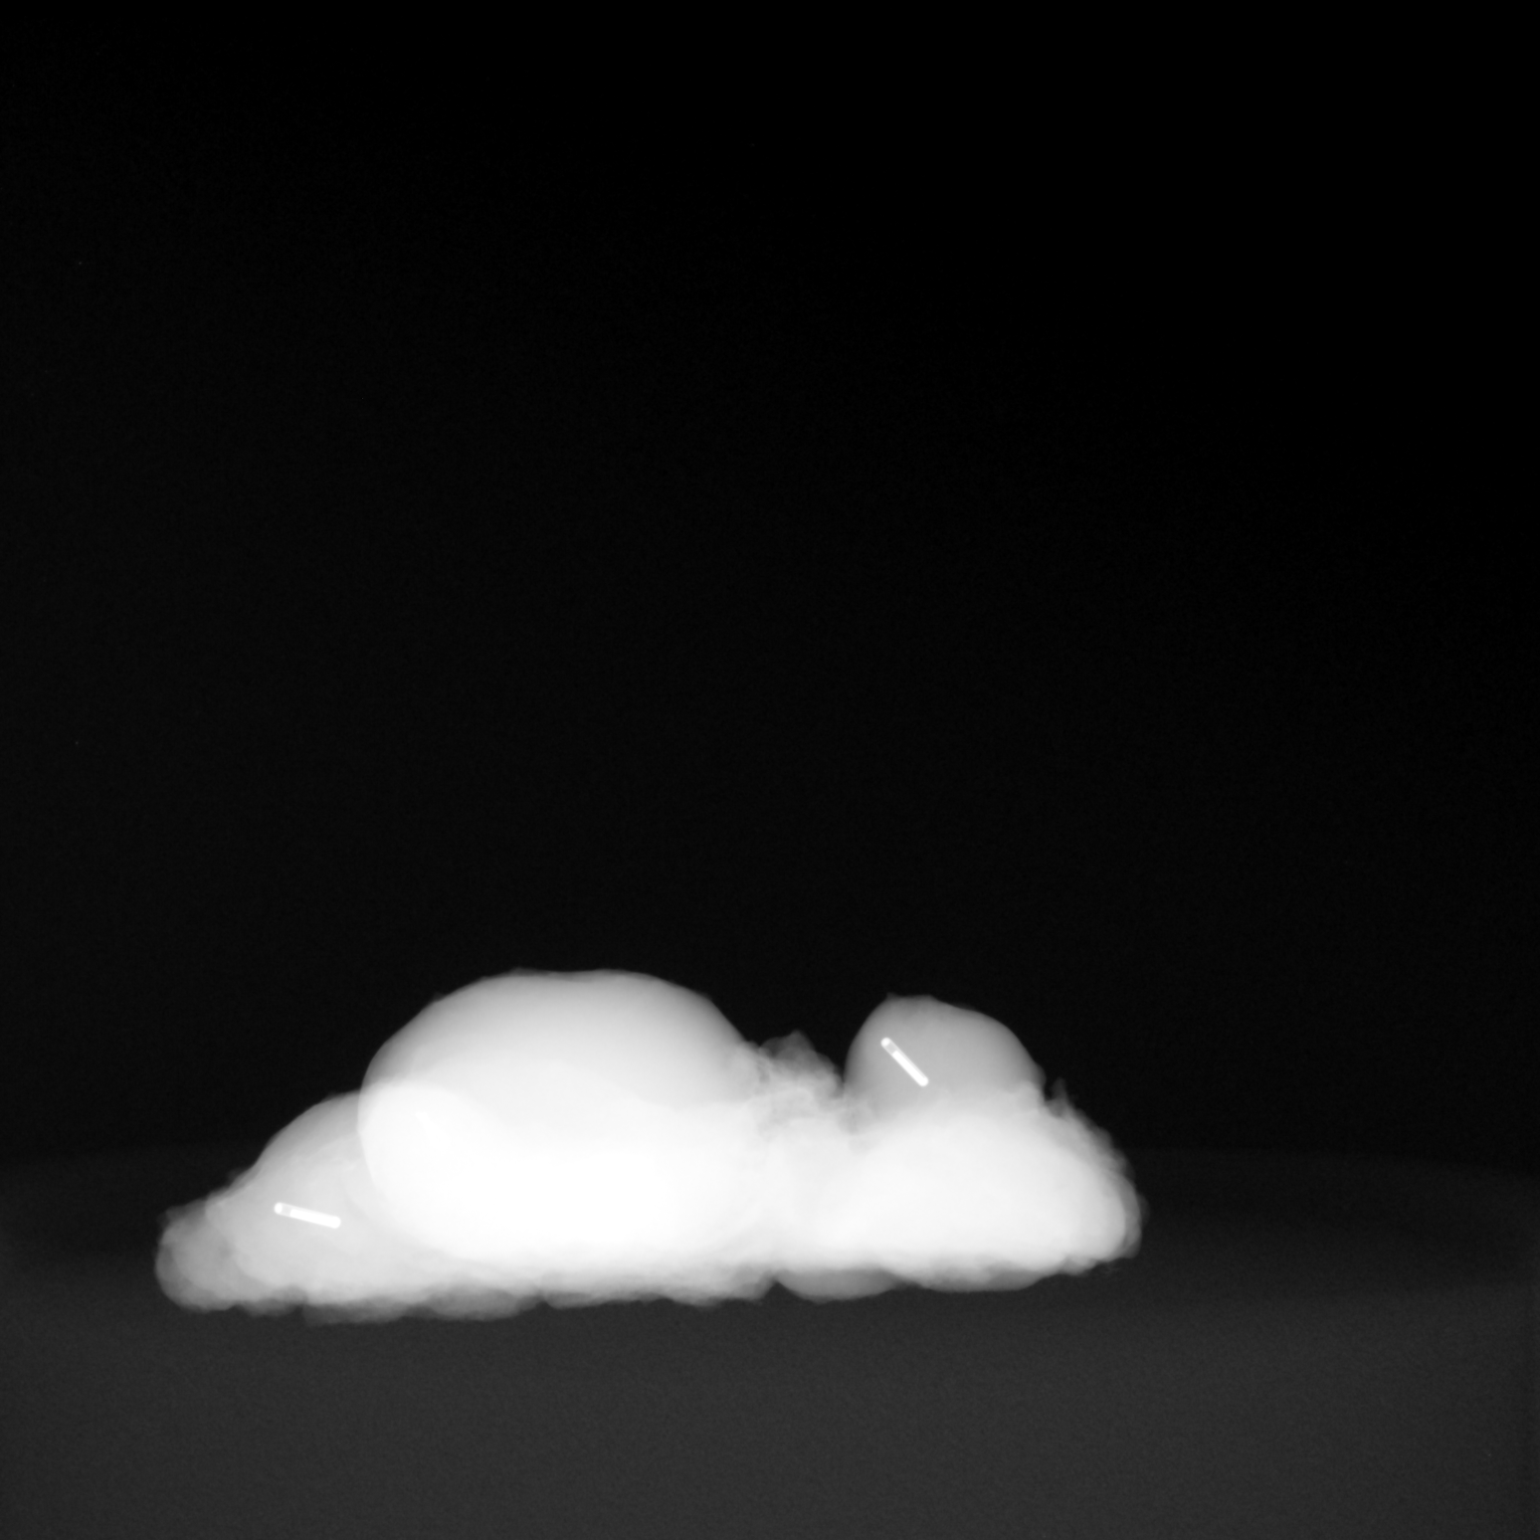
[im 2/2]
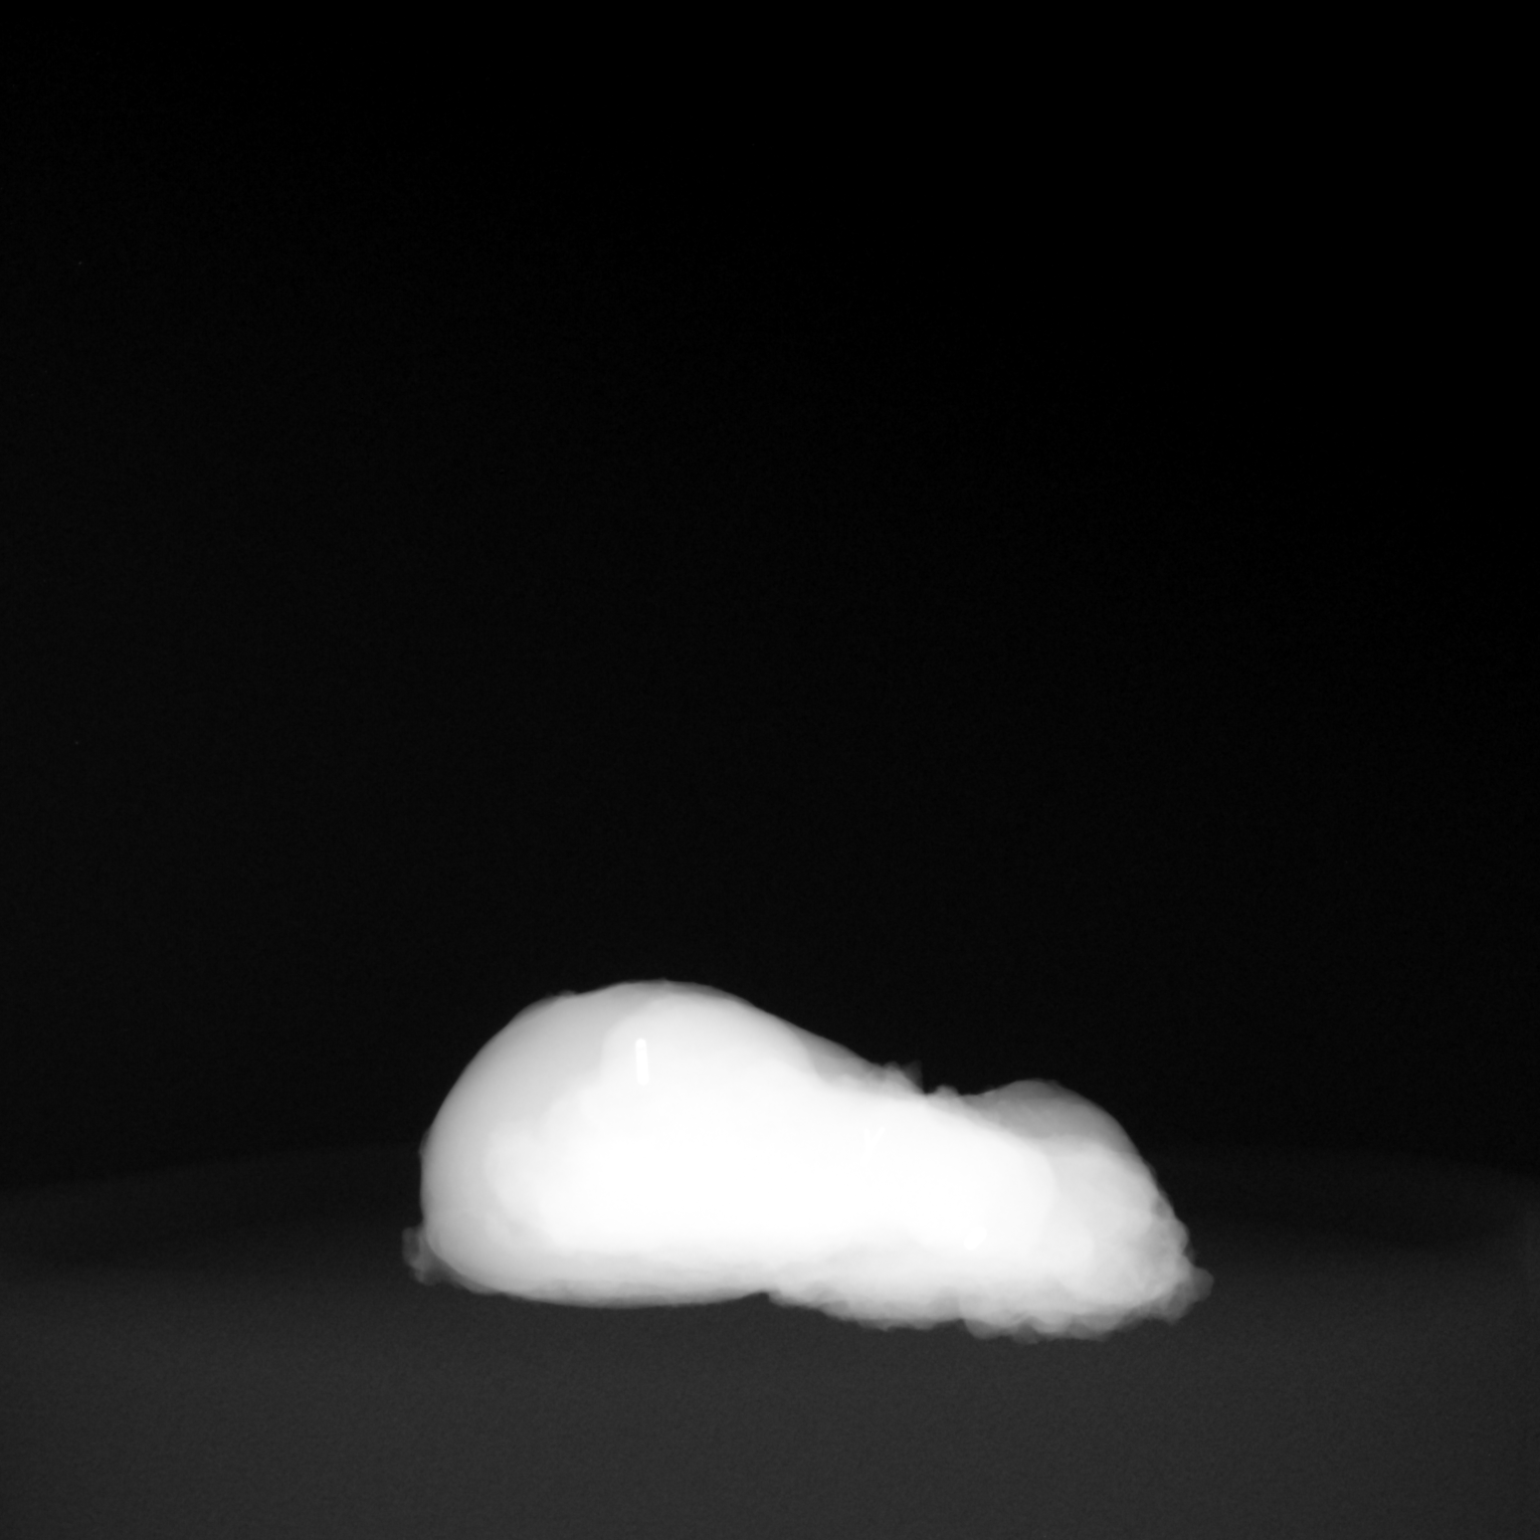

[2 of 2 positions shown; findings below may reference images not displayed]

FINDINGS: Status post excision of the right breast. The two radioactive seeds
and biopsy marker clip are present, completely intact, and were
marked for pathology.
IMPRESSION: Specimen radiograph of the right breast.

These results were called by telephone at the time of interpretation
on 11/22/2021 at [DATE] to provider ROSEMON ALLIO , who verbally
acknowledged these results.

## 2023-12-07 ENCOUNTER — Emergency Department (HOSPITAL_COMMUNITY): Admission: EM | Admit: 2023-12-07 | Discharge: 2023-12-07 | Disposition: A | Discharge status: Home / Self Care

## 2023-12-07 DIAGNOSIS — Z789 Other specified health status: Secondary | ICD-10-CM

## 2023-12-07 DIAGNOSIS — E109 Type 1 diabetes mellitus without complications: Secondary | ICD-10-CM | POA: Diagnosis not present

## 2023-12-07 DIAGNOSIS — N61 Mastitis without abscess: Secondary | ICD-10-CM | POA: Diagnosis present

## 2023-12-07 DIAGNOSIS — Z413 Encounter for ear piercing: Secondary | ICD-10-CM | POA: Diagnosis not present

## 2023-12-07 MED ORDER — NAPROXEN 500 MG PO TABS
500.0000 mg | ORAL_TABLET | Freq: Once | ORAL | Status: AC
  Administered 2023-12-07: 500 mg via ORAL
  Filled 2023-12-07: qty 1

## 2023-12-07 MED ORDER — DOXYCYCLINE HYCLATE 100 MG PO CAPS
100.0000 mg | ORAL_CAPSULE | Freq: Two times a day (BID) | ORAL | 0 refills | Status: AC
Start: 2023-12-07 — End: ?

## 2023-12-07 MED ORDER — DOXYCYCLINE HYCLATE 100 MG PO TABS
100.0000 mg | ORAL_TABLET | Freq: Once | ORAL | Status: AC
  Administered 2023-12-07: 100 mg via ORAL
  Filled 2023-12-07: qty 1

## 2023-12-07 NOTE — Discharge Instructions (Addendum)
 Apply warm compresses at least 3 times a day to promote further drainage. Keep the area covered with a bandage to prevent irritation. Change this at least once per day to keep the area clean and dry. We recommend doxycycline to prevent further infection as well as ibuprofen or Aleve for pain. You may benefit from removal of your piercing after 48-72 hours of antibiotic use. Follow-up with a primary care doctor for wound recheck in 48 hours. You may return to the emergency department for new or concerning symptoms.

## 2023-12-07 NOTE — ED Provider Notes (Signed)
 [place] EMERGENCY DEPARTMENT AT [facility] Provider Note   CSN: [id number] Arrival date & time: 12/07/23  0123     History  Chief Complaint  Patient presents with   Breast Discharge    Morgan [name] is a 23 y.o. female.  23 year old female with hx of IDDM presents to the emergency department for evaluation of pain and drainage to her left nipple at the site of a nipple piercing.  She has had this nipple piercing for the past 5 years and reports increased pain and drainage over the past 2 to 3 days.  She denies any trauma to the area or any associated fevers.  Drainage has been purulent.  She has been trying to keep the area clean with salt water.  Denies any bleeding from the nipple, nipple inversion.  The history is provided by the patient. No language interpreter was used.       Home Medications Prior to Admission medications   Medication Sig Start Date End Date Taking? Authorizing Provider  doxycycline (VIBRAMYCIN) 100 MG capsule Take 1 capsule (100 mg total) by mouth 2 (two) times daily. 12/07/23  Yes Antony Madura, PA-C  BD PEN NEEDLE NANO 2ND GEN 32G X 4 MM MISC Inject 1 Insert into the skin 4 (four) times daily. 02/15/23   [provider]  Blood Glucose Monitoring Suppl DEVI 1 each by Does not apply route in the morning, at noon, and at bedtime. May substitute to any manufacturer covered by patient's insurance. 08/24/23   Elpidio Anis, PA-C  Continuous Glucose Sensor (DEXCOM G7 SENSOR) MISC Inject 1 Implant into the skin every 14 (fourteen) days. 02/26/23   [provider]  HUMALOG 100 UNIT/ML injection Inject into the skin. 06/17/23   [provider]  TRESIBA FLEXTOUCH 100 UNIT/ML FlexTouch Pen Inject into the skin every morning.    [provider]  valACYclovir (VALTREX) 500 MG tablet Take 500 mg by mouth as needed (cold sores).    [provider]      Allergies    Aspirin and Ethanol-alcohol [ethanol]     Review of Systems   Review of Systems Ten systems reviewed and are negative for acute change, except as noted in the HPI.    Physical Exam Updated Vital Signs BP (!) 128/90   Pulse 77   Temp 97.7 F (36.5 C) (Oral)   Resp 17   SpO2 100%   Physical Exam Vitals and nursing note reviewed.  Constitutional:      General: She is not in acute distress.    Appearance: She is well-developed. She is not diaphoretic.     Comments: Nontoxic appearing and in NAD  HENT:     Head: Normocephalic and atraumatic.  Eyes:     General: No scleral icterus.    Conjunctiva/sclera: Conjunctivae normal.  Cardiovascular:     Rate and Rhythm: Normal rate and regular rhythm.     Pulses: Normal pulses.  Pulmonary:     Effort: Pulmonary effort is normal. No respiratory distress.     Comments: Respirations even and unlabored. Chest:     Comments: Scant discharge present at site of left nipple piercing. Edema noted to left nipple, when compared to right, without significant induration, heat to touch, or erythema of the left breast. No nipple inversion or skin stippling. Musculoskeletal:        General: Normal range of motion.     Cervical back: Normal range of motion.  Skin:    General: Skin  is warm and dry.     Coloration: Skin is not pale.     Findings: No erythema or rash.  Neurological:     Mental Status: She is alert and oriented to person, place, and time.     Coordination: Coordination normal.  Psychiatric:        Behavior: Behavior normal.      ED Results / Procedures / Treatments   Labs (all labs ordered are listed, but only abnormal results are displayed) Labs Reviewed - No data to display  EKG None  Radiology No results found.  Procedures Procedures    Medications Ordered in ED Medications  doxycycline (VIBRA-TABS) tablet 100 mg (100 mg Oral Given 12/07/23 0227)  naproxen (NAPROSYN) tablet 500 mg (500 mg Oral Given 12/07/23 0227)    ED Course/ Medical Decision  Making/ A&P                                 Medical Decision Making Risk Prescription drug management.   This patient presents to the ED for concern of L nipple pain, this involves an extensive number of treatment options, and is a complaint that carries with it a high risk of complications and morbidity.  The differential diagnosis includes cellulitis vs abscess vs mass/tumor   Co morbidities that complicate the patient evaluation  IDDM   Cardiac Monitoring:  The patient was maintained on a cardiac monitor.  I personally viewed and interpreted the cardiac monitored which showed an underlying rhythm of: NSR   Medicines ordered and prescription drug management:  I ordered medication including doxycycline for infection of L nipple piercing  Reevaluation of the patient after these medicines showed that the patient stayed the same I have reviewed the patients home medicines and have made adjustments as needed   Test Considered:  Breast US   Problem List / ED Course:  Concern for infected nipple piercing. No exam findings to suggest frank abscess. Abx initiated in the ED. Will continue with outpatient course and NSAIDs, warm compresses. May benefit from removal of piercing once abx on board x 72 hours. Until that time, counseled that removal of piercing may result in formation of abscess should purulence not have outlet for drainage.   Reevaluation:  After the interventions noted above, I reevaluated the patient and found that they have :stayed the same   Social Determinants of Health:  Lives independently   Dispostion:  After consideration of the diagnostic results and the patients response to treatment, I feel that the patent would benefit from outpatient abx course, warm compresses. Advised wound recheck in 2-3 days. Return precautions discussed and provided. Patient discharged in stable condition with no unaddressed concerns.          Final Clinical  Impression(s) / ED Diagnoses Final diagnoses:  Infection of breast, left  Body piercing    Rx / DC Orders ED Discharge Orders          Ordered    doxycycline (VIBRAMYCIN) 100 MG capsule  2 times daily        12/07/23 0205              Antony Madura, PA-C 12/07/23 0556    Coral Spikes, DO 12/07/23 [phone]

## 2023-12-07 NOTE — ED Triage Notes (Signed)
 Pt with pierced nipple (left) x 5 years , pt reports Thursday she began to have pus drainage, pt reports site infected and hurting, reports using solution at home with minimal symptom relief

## 2024-02-01 ENCOUNTER — Encounter (HOSPITAL_COMMUNITY): Payer: Self-pay | Admitting: Emergency Medicine

## 2024-02-01 ENCOUNTER — Emergency Department (HOSPITAL_COMMUNITY)
Admission: EM | Admit: 2024-02-01 | Discharge: 2024-02-01 | Discharge status: Against Medical Advice | Attending: Emergency Medicine | Admitting: Emergency Medicine

## 2024-02-01 DIAGNOSIS — E109 Type 1 diabetes mellitus without complications: Secondary | ICD-10-CM | POA: Diagnosis not present

## 2024-02-01 DIAGNOSIS — R1084 Generalized abdominal pain: Secondary | ICD-10-CM | POA: Diagnosis not present

## 2024-02-01 DIAGNOSIS — R112 Nausea with vomiting, unspecified: Secondary | ICD-10-CM | POA: Insufficient documentation

## 2024-02-01 DIAGNOSIS — Z5329 Procedure and treatment not carried out because of patient's decision for other reasons: Secondary | ICD-10-CM | POA: Insufficient documentation

## 2024-02-01 DIAGNOSIS — F17200 Nicotine dependence, unspecified, uncomplicated: Secondary | ICD-10-CM | POA: Diagnosis not present

## 2024-02-01 DIAGNOSIS — R519 Headache, unspecified: Secondary | ICD-10-CM | POA: Insufficient documentation

## 2024-02-01 DIAGNOSIS — R197 Diarrhea, unspecified: Secondary | ICD-10-CM | POA: Diagnosis not present

## 2024-02-01 LAB — I-STAT CHEM 8, ED
BUN: 11 mg/dL (ref 6–20)
Calcium, Ion: 1.16 mmol/L (ref 1.15–1.40)
Chloride: 101 mmol/L (ref 98–111)
Creatinine, Ser: 0.8 mg/dL (ref 0.44–1.00)
Glucose, Bld: 192 mg/dL — ABNORMAL HIGH (ref 70–99)
HCT: 42 % (ref 36.0–46.0)
Hemoglobin: 14.3 g/dL (ref 12.0–15.0)
Potassium: 3.4 mmol/L — ABNORMAL LOW (ref 3.5–5.1)
Sodium: 136 mmol/L (ref 135–145)
TCO2: 23 mmol/L (ref 22–32)

## 2024-02-01 LAB — COMPREHENSIVE METABOLIC PANEL WITH GFR
ALT: 16 U/L (ref 0–44)
AST: 27 U/L (ref 15–41)
Albumin: 4.4 g/dL (ref 3.5–5.0)
Alkaline Phosphatase: 58 U/L (ref 38–126)
Anion gap: 12 (ref 5–15)
BUN: 10 mg/dL (ref 6–20)
CO2: 22 mmol/L (ref 22–32)
Calcium: 9.6 mg/dL (ref 8.9–10.3)
Chloride: 101 mmol/L (ref 98–111)
Creatinine, Ser: 0.85 mg/dL (ref 0.44–1.00)
GFR, Estimated: >60 mL/min (ref >60)
Glucose, Bld: 194 mg/dL — ABNORMAL HIGH (ref 70–99)
Potassium: 3.4 mmol/L — ABNORMAL LOW (ref 3.5–5.1)
Sodium: 135 mmol/L (ref 135–145)
Total Bilirubin: 1.4 mg/dL — ABNORMAL HIGH (ref 0.0–1.2)
Total Protein: 7.2 g/dL (ref 6.5–8.1)

## 2024-02-01 LAB — I-STAT VENOUS BLOOD GAS, ED
Acid-base deficit: 1 mmol/L (ref 0.0–2.0)
Bicarbonate: 22.3 mmol/L (ref 20.0–28.0)
Calcium, Ion: 1.16 mmol/L (ref 1.15–1.40)
HCT: 40 % (ref 36.0–46.0)
Hemoglobin: 13.6 g/dL (ref 12.0–15.0)
O2 Saturation: 99 %
Potassium: 3.4 mmol/L — ABNORMAL LOW (ref 3.5–5.1)
Sodium: 136 mmol/L (ref 135–145)
TCO2: 23 mmol/L (ref 22–32)
pCO2, Ven: 31.6 mmHg — ABNORMAL LOW (ref 44–60)
pH, Ven: 7.458 — ABNORMAL HIGH (ref 7.25–7.43)
pO2, Ven: 146 mmHg — ABNORMAL HIGH (ref 32–45)

## 2024-02-01 LAB — URINALYSIS, W/ REFLEX TO CULTURE (INFECTION SUSPECTED)
Bilirubin Urine: NEGATIVE
Glucose, UA: NEGATIVE mg/dL
Hgb urine dipstick: NEGATIVE
Ketones, ur: 5 mg/dL — AB
Leukocytes,Ua: NEGATIVE
Nitrite: POSITIVE — AB
Protein, ur: NEGATIVE mg/dL
Specific Gravity, Urine: 1.011 (ref 1.005–1.030)
pH: 5 (ref 5.0–8.0)

## 2024-02-01 LAB — BETA-HYDROXYBUTYRIC ACID: Beta-Hydroxybutyric Acid: 0.24 mmol/L (ref 0.05–0.27)

## 2024-02-01 LAB — HCG, SERUM, QUALITATIVE: Preg, Serum: NEGATIVE

## 2024-02-01 LAB — CBG MONITORING, ED: Glucose-Capillary: 186 mg/dL — ABNORMAL HIGH (ref 70–99)

## 2024-02-01 LAB — OSMOLALITY: Osmolality: 289 mosm/kg (ref 275–295)

## 2024-02-01 LAB — LIPASE, BLOOD: Lipase: 22 U/L (ref 11–51)

## 2024-02-01 MED ORDER — LACTATED RINGERS IV BOLUS
1000.0000 mL | Freq: Once | INTRAVENOUS | Status: AC
  Administered 2024-02-01: 1000 mL via INTRAVENOUS

## 2024-02-01 MED ORDER — ONDANSETRON HCL 4 MG/2ML IJ SOLN
4.0000 mg | Freq: Once | INTRAMUSCULAR | Status: AC
  Administered 2024-02-01: 4 mg via INTRAVENOUS
  Filled 2024-02-01: qty 2

## 2024-02-01 MED ORDER — METOCLOPRAMIDE HCL 5 MG/ML IJ SOLN
10.0000 mg | Freq: Once | INTRAMUSCULAR | Status: AC
  Administered 2024-02-01: 10 mg via INTRAVENOUS
  Filled 2024-02-01: qty 2

## 2024-02-01 MED ORDER — CEPHALEXIN 250 MG PO CAPS: 500.0000 mg | ORAL_CAPSULE | Freq: Once | ORAL | Status: DC

## 2024-02-01 MED ORDER — MAGNESIUM SULFATE IN D5W 1-5 GM/100ML-% IV SOLN
1.0000 g | Freq: Once | INTRAVENOUS | Status: DC
  Filled 2024-02-01: qty 100

## 2024-02-01 MED ORDER — HYDROCODONE-ACETAMINOPHEN 5-325 MG PO TABS: 1.0000 | ORAL_TABLET | Freq: Once | ORAL | Status: DC

## 2024-02-01 NOTE — ED Triage Notes (Signed)
 Patient BIB EMS from home. Patient was diagnosed 2 years ago with DM I; today started with vomiting, diarrhea, abdominal cramping, possible UTI, and headache. She is concerned that she may be in DKA.

## 2024-02-01 NOTE — ED Notes (Signed)
 Patient told the NST that she was leaving & to take her IV out; tech removed the IV & notified me that patient was leaving.  Morgan [name], [place] notified that patient left.

## 2024-02-01 NOTE — ED Notes (Signed)
 ED Provider at bedside.

## 2024-02-01 NOTE — ED Notes (Signed)
 Failed PO challenge; provider notified via Secure Chat

## 2024-02-01 NOTE — ED Provider Notes (Signed)
 [place] EMERGENCY DEPARTMENT AT [facility] Provider Note   CSN: [id number] Arrival date & time: 02/01/24  0013     History  Chief Complaint  Patient presents with   Vomiting   Headache    Morgan [name] is a 23 y.o. female.  Patient with past medical history significant for type I DM, 1 episode of possible seizure, anxiety, migraines presents to the emergency room via EMS complaining of vomiting, diarrhea, abdominal cramping which began earlier today.  Patient also complains of headache and "possible UTI".  She is worried that she may be in diabetic ketoacidosis.  She states she has been unable to keep any fluids down today.  She complains of generalized abdominal discomfort but denies any specific tenderness.  She denies shortness of breath, chest pain, fever.   Headache      Home Medications Prior to Admission medications   Medication Sig Start Date End Date Taking? Authorizing Provider  BD PEN NEEDLE NANO 2ND GEN 32G X 4 MM MISC Inject 1 Insert into the skin 4 (four) times daily. 02/15/23   [provider]  Blood Glucose Monitoring Suppl DEVI 1 each by Does not apply route in the morning, at noon, and at bedtime. May substitute to any manufacturer covered by patient's insurance. 08/24/23   Mandy Second, PA-C  Continuous Glucose Sensor (DEXCOM G7 SENSOR) MISC Inject 1 Implant into the skin every 14 (fourteen) days. 02/26/23   [provider]  doxycycline  (VIBRAMYCIN ) 100 MG capsule Take 1 capsule (100 mg total) by mouth 2 (two) times daily. 12/07/23   Carleton Cheek, PA-C  HUMALOG 100 UNIT/ML injection Inject into the skin. 06/17/23   [provider]  TRESIBA  FLEXTOUCH 100 UNIT/ML FlexTouch Pen Inject into the skin every morning.    [provider]  valACYclovir (VALTREX) 500 MG tablet Take 500 mg by mouth as needed (cold sores).    [provider]      Allergies    Aspirin and Ethanol-alcohol [ethanol]    Review of  Systems   Review of Systems  Neurological:  Positive for headaches.    Physical Exam Updated Vital Signs BP (!) 124/103 (BP Location: Right Arm)   Pulse 83   Temp 98.4 F (36.9 C) (Oral)   Resp 14   SpO2 100%  Physical Exam Vitals and nursing note reviewed.  Constitutional:      General: She is not in acute distress.    Appearance: She is well-developed.  HENT:     Head: Normocephalic and atraumatic.     Mouth/Throat:     Mouth: Mucous membranes are moist.  Eyes:     Conjunctiva/sclera: Conjunctivae normal.  Cardiovascular:     Rate and Rhythm: Normal rate and regular rhythm.  Pulmonary:     Effort: Pulmonary effort is normal. No respiratory distress.     Breath sounds: Normal breath sounds.  Abdominal:     Palpations: Abdomen is soft.     Tenderness: There is no abdominal tenderness.  Musculoskeletal:        General: No swelling.     Cervical back: Neck supple.  Skin:    General: Skin is warm and dry.     Capillary Refill: Capillary refill takes less than 2 seconds.  Neurological:     Mental Status: She is alert.  Psychiatric:        Mood and Affect: Mood normal.     ED Results / Procedures / Treatments   Labs (all labs ordered  are listed, but only abnormal results are displayed) Labs Reviewed  COMPREHENSIVE METABOLIC PANEL WITH GFR - Abnormal; Notable for the following components:      Result Value   Potassium 3.4 (*)    Glucose, Bld 194 (*)    Total Bilirubin 1.4 (*)    All other components within normal limits  URINALYSIS, W/ REFLEX TO CULTURE (INFECTION SUSPECTED) - Abnormal; Notable for the following components:   Color, Urine ORANGE (*)    APPearance HAZY (*)    Ketones, ur 5 (*)    Nitrite POSITIVE (*)    Bacteria, UA FEW (*)    All other components within normal limits  I-STAT CHEM 8, ED - Abnormal; Notable for the following components:   Potassium 3.4 (*)    Glucose, Bld 192 (*)    All other components within normal limits  I-STAT VENOUS  BLOOD GAS, ED - Abnormal; Notable for the following components:   pH, Ven 7.458 (*)    pCO2, Ven 31.6 (*)    pO2, Ven 146 (*)    Potassium 3.4 (*)    All other components within normal limits  CBG MONITORING, ED - Abnormal; Notable for the following components:   Glucose-Capillary 186 (*)    All other components within normal limits  LIPASE, BLOOD  HCG, SERUM, QUALITATIVE  BETA-HYDROXYBUTYRIC ACID  OSMOLALITY  CBC WITH DIFFERENTIAL/PLATELET  CBC WITH DIFFERENTIAL/PLATELET    EKG None  Radiology No results found.  Procedures Procedures    Medications Ordered in ED Medications  magnesium sulfate IVPB 1 g 100 mL (has no administration in time range)  HYDROcodone -acetaminophen  (NORCO/VICODIN) 5-325 MG per tablet 1 tablet (has no administration in time range)  cephALEXin (KEFLEX) capsule 500 mg (has no administration in time range)  lactated ringers  bolus 1,000 mL (0 mLs Intravenous Stopped 02/01/24 0250)  ondansetron  (ZOFRAN ) injection 4 mg (4 mg Intravenous Given 02/01/24 0043)  metoCLOPramide (REGLAN) injection 10 mg (10 mg Intravenous Given 02/01/24 0250)    ED Course/ Medical Decision Making/ A&P                                 Medical Decision Making Amount and/or Complexity of Data Reviewed Labs: ordered.  Risk Prescription drug management.   This patient presents to the ED for concern of nausea, vomiting, diarrhea, this involves an extensive number of treatment options, and is a complaint that carries with it a high risk of complications and morbidity.  The differential diagnosis includes endocrine abnormality, gastroenteritis, appendicitis, others   Co morbidities that complicate the patient evaluation  Type I DM   Additional history obtained:  Additional history obtained from EMS   Lab Tests:  I Ordered, and personally interpreted labs.  The pertinent results include: Venous pH 7.458, venous CO2 31.6, venous O2 146; potassium 3.4; glucose 186; UA  nitrite positive with few bacteria   Problem List / ED Course / Critical interventions / Medication management   I ordered medication including LR bolus for fluid resuscitation, Zofran  for nausea, Reglan for continued nausea Reevaluation of the patient after these medicines showed that the patient stayed the same I have reviewed the patients home medicines and have made adjustments as needed   Social Determinants of Health:  Patient has Medicaid for primary health insurance type, is a daily smoker   Test / Admission - Considered:  Patient treated for nausea and vomiting and a general unwell feeling. Patient  had no focal abdominal tenderness, was being treated with antiemetics and fluids. Patient stated that she didn't feel like she was improving. I planned to give further medications, treatment for apparent UTI, but patient eloped from the emergency department.          Final Clinical Impression(s) / ED Diagnoses Final diagnoses:  Nausea and vomiting, unspecified vomiting type    Rx / DC Orders ED Discharge Orders     None         Delories Fetter 02/01/24 0335    Alissa April, MD 02/01/24 [phone]
